# Patient Record
Sex: Female | Born: 1955 | Race: White | Hispanic: No | Marital: Married | Smoking: Current every day smoker
Health system: Southern US, Community
[De-identification: ages and names within clinical notes are randomized; demographics above are authoritative.]

## PROBLEM LIST (undated history)

## (undated) DIAGNOSIS — I10 Essential (primary) hypertension: Secondary | ICD-10-CM

## (undated) DIAGNOSIS — M1990 Unspecified osteoarthritis, unspecified site: Secondary | ICD-10-CM

## (undated) DIAGNOSIS — J302 Other seasonal allergic rhinitis: Secondary | ICD-10-CM

## (undated) DIAGNOSIS — E785 Hyperlipidemia, unspecified: Secondary | ICD-10-CM

## (undated) HISTORY — PX: APPENDECTOMY: SHX54

## (undated) HISTORY — PX: BLADDER REPAIR: SHX76

## (undated) HISTORY — PX: COLONOSCOPY: SHX174

## (undated) HISTORY — PX: TONSILLECTOMY: SUR1361

## (undated) HISTORY — PX: INNER EAR SURGERY: SHX679

## (undated) HISTORY — PX: ABDOMINAL HYSTERECTOMY: SHX81

---

## 2012-08-18 ENCOUNTER — Encounter: Payer: Self-pay | Admitting: *Deleted

## 2012-08-18 ENCOUNTER — Emergency Department
Admission: EM | Admit: 2012-08-18 | Discharge: 2012-08-18 | Disposition: A | Discharge status: Home / Self Care | Payer: BC Managed Care – PPO | Source: Home / Self Care | Attending: Family Medicine | Admitting: Family Medicine

## 2012-08-18 ENCOUNTER — Emergency Department (INDEPENDENT_AMBULATORY_CARE_PROVIDER_SITE_OTHER): Payer: BC Managed Care – PPO

## 2012-08-18 DIAGNOSIS — R05 Cough: Secondary | ICD-10-CM

## 2012-08-18 DIAGNOSIS — R0989 Other specified symptoms and signs involving the circulatory and respiratory systems: Secondary | ICD-10-CM

## 2012-08-18 DIAGNOSIS — J209 Acute bronchitis, unspecified: Secondary | ICD-10-CM

## 2012-08-18 DIAGNOSIS — R509 Fever, unspecified: Secondary | ICD-10-CM

## 2012-08-18 HISTORY — DX: Essential (primary) hypertension: I10

## 2012-08-18 HISTORY — DX: Other seasonal allergic rhinitis: J30.2

## 2012-08-18 HISTORY — DX: Hyperlipidemia, unspecified: E78.5

## 2012-08-18 MED ORDER — BENZONATATE 200 MG PO CAPS: 200.0000 mg | ORAL_CAPSULE | Freq: Every day | ORAL | Status: DC

## 2012-08-18 MED ORDER — DOXYCYCLINE HYCLATE 100 MG PO CAPS: 100.0000 mg | ORAL_CAPSULE | Freq: Two times a day (BID) | ORAL | Status: DC

## 2012-08-18 NOTE — ED Provider Notes (Signed)
History     CSN: [id number]  Arrival date & time 08/18/12  1131   First MD Initiated Contact with Patient 08/18/12 1201      Chief Complaint  Patient presents with  . Generalized Body Aches  . Cough       HPI Comments: Patient was sent to our facility from [facility] with recommendation for a chest X-ray. Patient complains of onset of a partly-productive cough 5 days ago, without sore throat.  She has a history of seasonal rhinitis but does not feel like she has had an increase in sinus congestion.  Yesterday she developed a fever and felt worse.  Her ears feel clogged.  She complains of tightness in her anterior chest, worse when coughing, and has occasional wheezing.  She smokes one-half pack per day  The history is provided by the patient.    Past Medical History  Diagnosis Date  . Hypertension   . Hyperlipemia   . Seasonal allergies     Past Surgical History  Procedure Laterality Date  . Abdominal hysterectomy    . Tonsillectomy    . Appendectomy    . Bladder repair      Family History  Problem Relation Age of Onset  . Diabetes Mother   . Stroke Mother     History  Substance Use Topics  . Smoking status: Current  Every Day Smoker -- 0.50 packs/day  . Smokeless tobacco: Not on file  . Alcohol Use: Yes    OB History   Grav Para Term Preterm Abortions TAB SAB Ect Mult Living                  Review of Systems No sore throat + cough No pleuritic pain but has tightness in anterior chest + wheezing + nasal congestion ? post-nasal drainage No sinus pain/pressure No itchy/red eyes ? earache No hemoptysis No SOB + fever, + chills No nausea No vomiting No abdominal pain No diarrhea No urinary symptoms No skin rashes + fatigue + myalgias No headache Used OTC meds without relief  Allergies  Ivp dye  Home Medications   Current Outpatient Rx  Name  Route  Sig  Dispense  Refill  . aspirin 81 MG tablet   Oral   Take 81 mg by mouth daily.         . Biotin 5000 MCG CAPS   Oral   Take by mouth.         . Cetirizine HCl (KLS ALLER-TEC PO)   Oral   Take by mouth.         . chlorthalidone (HYGROTON) 25 MG tablet   Oral   Take 25 mg by mouth daily.         . MULTIPLE VITAMIN PO   Oral   Take by mouth.         . niacin 100 MG tablet   Oral   Take 100 mg by mouth daily with breakfast.         . simvastatin (ZOCOR) 80 MG tablet   Oral   Take 80 mg by mouth at bedtime.         . vitamin E 400 UNIT capsule   Oral   Take 400 Units by mouth daily.         . benzonatate (TESSALON) 200 MG capsule   Oral   Take 1 capsule (200 mg total) by mouth at bedtime.   12 capsule   0   . doxycycline (VIBRAMYCIN) 100 MG capsule   Oral   Take 1 capsule (100 mg total) by mouth 2 (two) times daily.   20 capsule   0     BP 147/79  Pulse 72  Temp(Src) 98 F (36.7 C) (Oral)  Resp 18  Ht 5\' 4"  (1.626 m)  Wt 160 lb (72.576 kg)  BMI 27.45 kg/m2  SpO2 96%  Physical Exam Nursing notes and Vital Signs reviewed. Appearance:  Patient appears healthy, stated age, and in no acute distress Eyes:  Pupils are equal, round, and reactive to light and accomodation.   Extraocular movement is intact.  Conjunctivae are not inflamed  Ears:  Canals normal.  Tympanic membranes normal.  Nose:  Mildly congested turbinates.  No sinus tenderness.   Pharynx:  Normal Neck:  Supple.  No adenopathy  Lungs:   Rhonchi and wheezes left anterior base.  Breath sounds are equal.  Heart:  Regular rate and rhythm without murmurs, rubs, or gallops.  Abdomen:  Nontender without masses or hepatosplenomegaly.  Bowel sounds are present.  No CVA or flank tenderness.  Extremities:  No edema.  No calf tenderness Skin:  No rash present.   ED Course  Procedures  none   Dg Chest 2 View  08/18/2012  *RADIOLOGY REPORT*  Clinical Data: Cough, fever and congestion.  CHEST - 2 VIEW  Comparison: None.  Findings: Trachea is midline.  Heart size normal.  Lungs are mildly hyperinflated but otherwise clear.  No pleural fluid.  IMPRESSION: No acute findings.   Original Report Authenticated By: Leanna Battles, M.D.      1. Acute bronchitis       MDM  Begin doxycycline.  Prescription written for Benzonatate [facility]) to take at bedtime for night-time cough.   Take plain Mucinex (guaifenesin) twice daily for cough and congestion.  Increase fluid intake, rest. May use Afrin nasal spray (or generic oxymetazoline) twice daily for about 5 days.  Also recommend using saline nasal spray several times daily and saline nasal irrigation (AYR is a common brand) Stop all antihistamines for now, and other non-prescription cough/cold preparations. May take Ibuprofen 200mg , 4 tabs every 8 hours with food for chest/sternum discomfort. Follow-up with family doctor if not improving about one week.        Lattie Haw, MD 08/18/12 1255

## 2012-08-18 NOTE — ED Notes (Signed)
Pt c/o cough, body aches, wheezing and ears feel full x 5 days. She has mucinex, coricidin, and tylenol. She had a fever of 100.3 this weekend.

## 2014-10-04 IMAGING — CR DG CHEST 2V
2 series · 2 of 2 positions shown · non-contrast
Comparison: None.

CLINICAL DATA: Cough, fever and congestion.

CHEST - 2 VIEW

[view not recorded (1 of 2)]
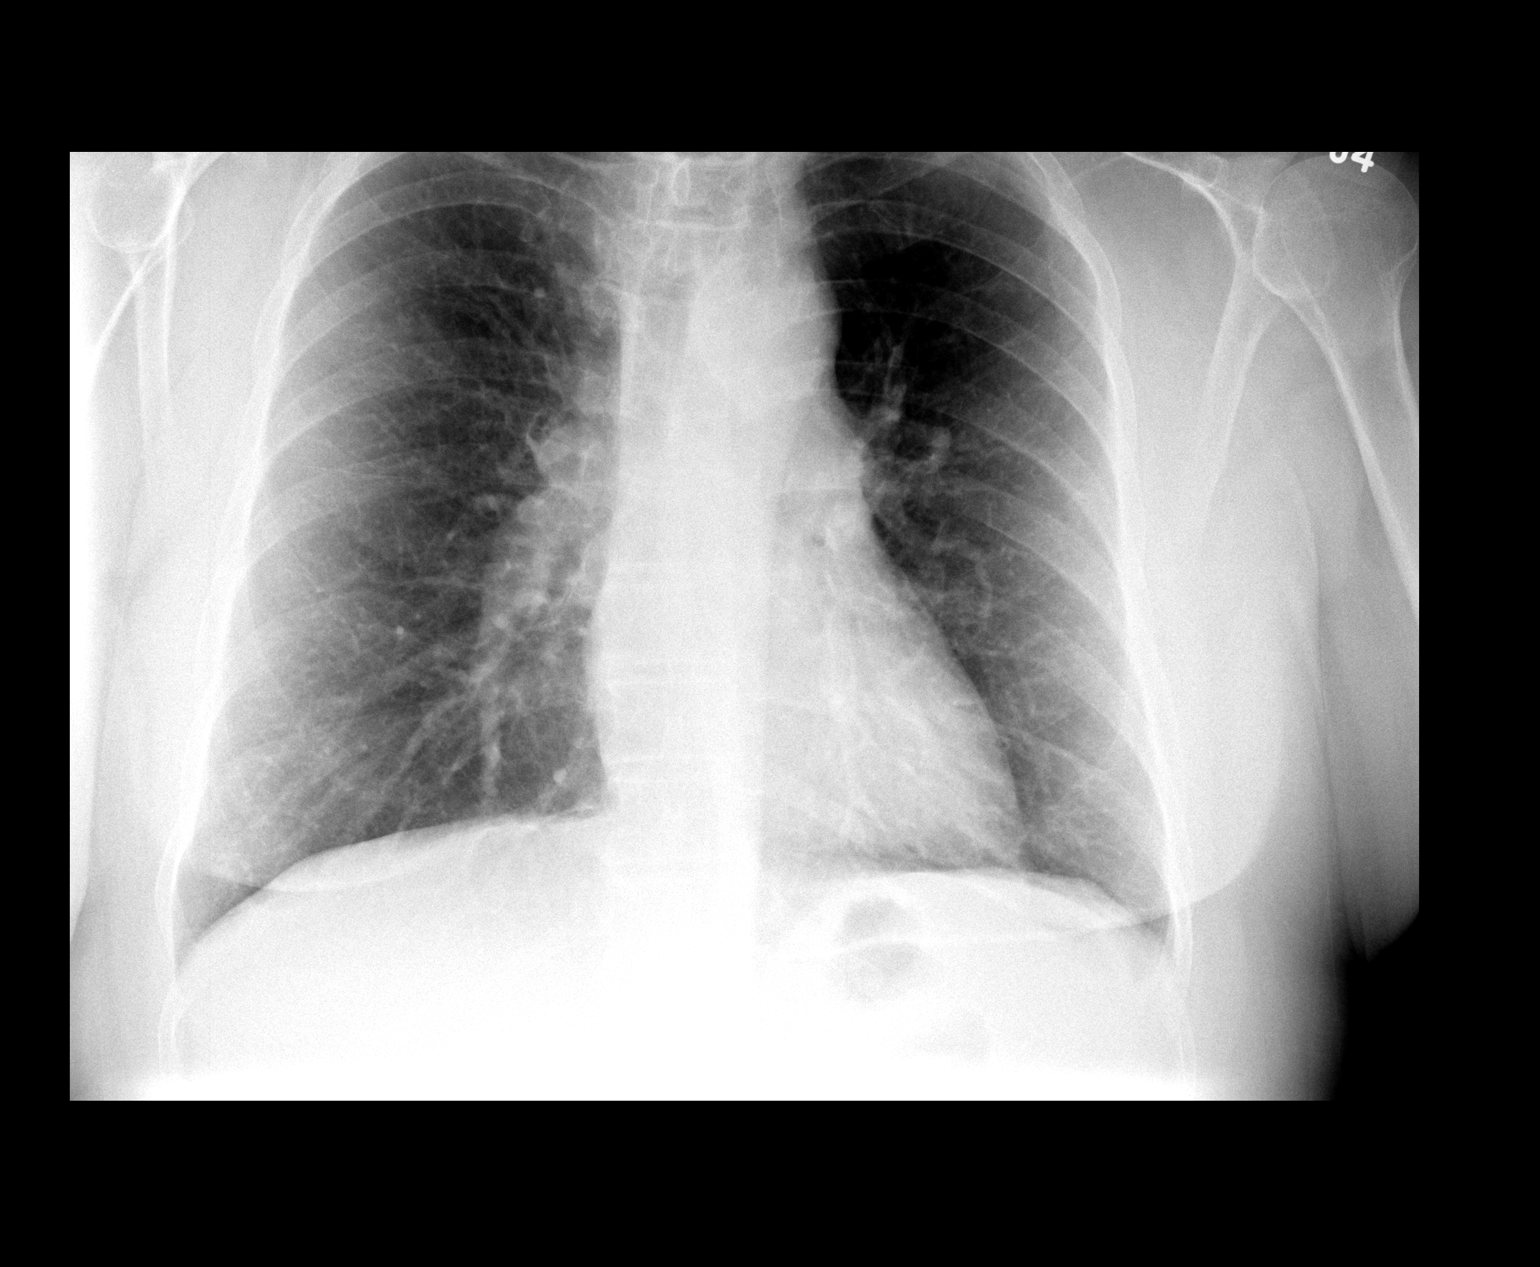

[view not recorded (2 of 2)]
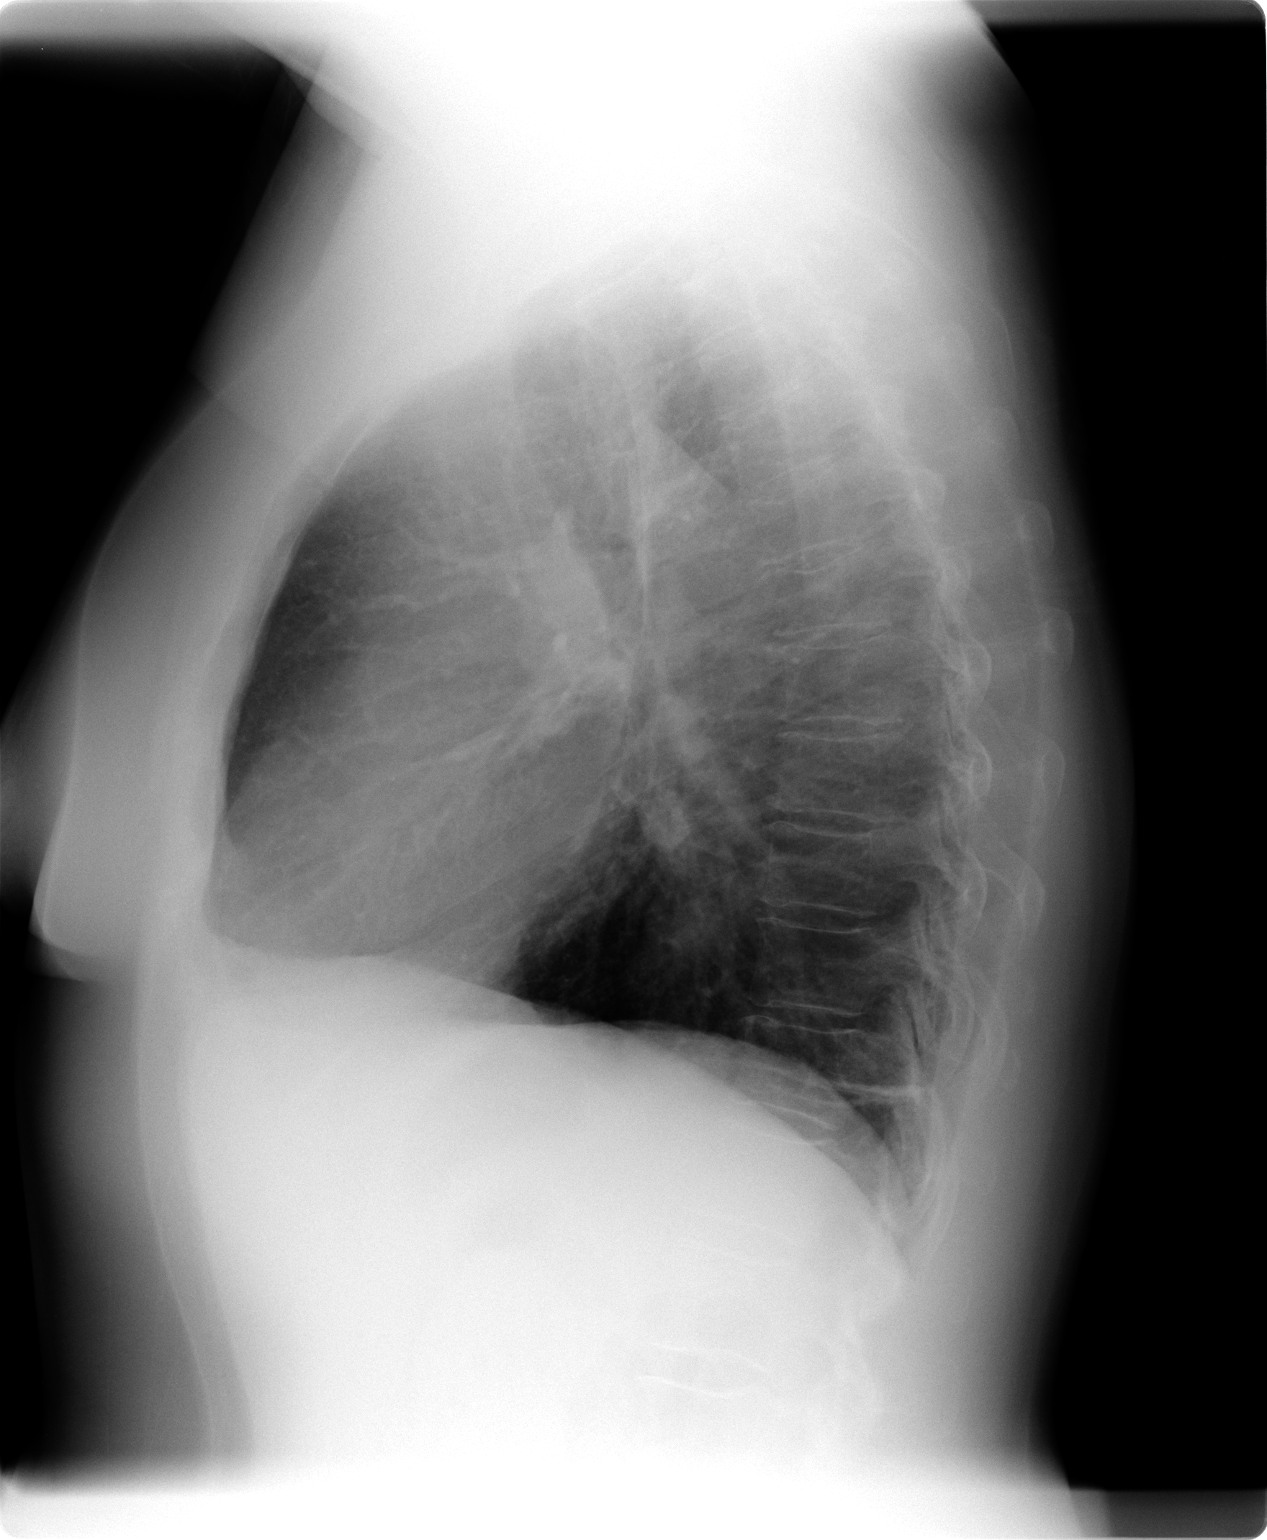

[2 of 2 positions shown; findings below may reference images not displayed]

FINDINGS: Trachea is midline.  Heart size normal.  Lungs are mildly
hyperinflated but otherwise clear.  No pleural fluid.
IMPRESSION: No acute findings.

## 2020-07-21 ENCOUNTER — Emergency Department (INDEPENDENT_AMBULATORY_CARE_PROVIDER_SITE_OTHER)
Admission: EM | Admit: 2020-07-21 | Discharge: 2020-07-21 | Disposition: A | Discharge status: Home / Self Care | Payer: Medicare Other | Source: Home / Self Care | Attending: Family Medicine | Admitting: Family Medicine

## 2020-07-21 ENCOUNTER — Other Ambulatory Visit: Payer: Self-pay

## 2020-07-21 DIAGNOSIS — H9202 Otalgia, left ear: Secondary | ICD-10-CM

## 2020-07-21 MED ORDER — AMOXICILLIN-POT CLAVULANATE 875-125 MG PO TABS: 1.0000 | ORAL_TABLET | Freq: Two times a day (BID) | ORAL | 0 refills | Status: DC

## 2020-07-21 MED ORDER — AZELASTINE-FLUTICASONE 137-50 MCG/ACT NA SUSP: 1.0000 | Freq: Two times a day (BID) | NASAL | 0 refills | Status: DC

## 2020-07-21 NOTE — ED Provider Notes (Signed)
[facility]    CSN: [id number] Arrival date & time: 07/21/20  1656      History   Chief Complaint Chief Complaint  Patient presents with  . Otalgia    LT    HPI Amber Tate is a 65 y.o. female.   HPI   Patient has a problem with left ear.  It is painful.  Has dereased hearing.  Has noted drainage on the pillow in the morning when she wakes up.  Has some allergies as well.  No real runny or stuffy nose, no pressure pain in the sinuses.  No sore throat.  No fever Has a history of problems with this ear in the past.  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  A patch was placed. Under care for hypertension hyperlipidemia.  Has a primary care doctor.  Past Medical History:  Diagnosis Date  . Hyperlipemia   . Hypertension   . Seasonal allergies     There are no problems to display for this patient.   Past Surgical History:  Procedure Laterality Date  . ABDOMINAL HYSTERECTOMY    . APPENDECTOMY    . BLADDER REPAIR    . TONSILLECTOMY      OB History   No obstetric history on file.      Home Medications    Prior to Admission medications   Medication Sig Start Date End Date Taking? Authorizing Provider  amoxicillin-clavulanate (AUGMENTIN) 875-125 MG tablet Take 1 tablet by mouth every 12 (twelve) hours. 07/21/20  Yes Eustace Moore, MD  Azelastine-Fluticasone [phone] MCG/ACT SUSP Place 1 puff into the nose in the morning and at bedtime. 07/21/20  Yes Eustace Moore, MD  amLODipine (NORVASC) 2.5 MG tablet Take 2.5 mg by mouth daily. 06/09/20   [provider]  aspirin 81 MG tablet Take 81 mg by mouth daily.    [provider]  Biotin 5000 MCG CAPS Take by mouth.    [provider]  Cetirizine HCl (KLS ALLER-TEC PO) Take by mouth.    [provider]  MULTIPLE VITAMIN PO Take by mouth.    [provider]  rosuvastatin (CRESTOR) 10 MG tablet Take 10 mg by mouth daily.  06/09/20   [provider]  vitamin E 400 UNIT capsule Take 400 Units by mouth daily.    [provider]  chlorthalidone (HYGROTON) 25 MG tablet Take 25 mg by mouth daily.  07/21/20  [provider]  simvastatin (ZOCOR) 80 MG tablet Take 80 mg by mouth at bedtime.  07/21/20  [provider]    Family History Family History  Problem Relation Age of Onset  . Diabetes Mother   . Stroke Mother     Social History Social History   Tobacco Use  . Smoking status: Current Every Day Smoker    Packs/day: 0.50  . Smokeless tobacco: Never Used  Vaping Use  . Vaping Use: Never used  Substance Use Topics  . Alcohol use: Yes  . Drug use: No     Allergies   Ivp dye [iodinated diagnostic agents]   Review of Systems Review of Systems See HPI  Physical Exam Triage Vital Signs ED Triage Vitals  Enc Vitals Group     BP 07/21/20 1715 (!) 158/87     Pulse Rate 07/21/20 1715 75     Resp 07/21/20 1715 18     Temp 07/21/20 1715 98.1 F (36.7 C)  Temp Source 07/21/20 1715 Oral     SpO2 07/21/20 1715 95 %     Weight --      Height --      Head Circumference --      Peak Flow --      Pain Score 07/21/20 1718 6     Pain Loc --      Pain Edu? --      Excl. in [place]? --    No data found.  Updated Vital Signs BP (!) 158/87 (BP Location: Left Arm)   Pulse 75   Temp 98.1 F (36.7 C) (Oral)   Resp 18   SpO2 95%      Physical Exam Constitutional:      General: She is not in acute distress.    Appearance: She is well-developed.  HENT:     Head: Normocephalic and atraumatic.     Right Ear: Tympanic membrane, ear canal and external ear normal.     Left Ear: Ear canal and external ear normal.     Ears:     Comments: Scarring of TM.  Dull appearance.  Moisture is seen in the proximal EAC with no visible perforation Eyes:     Conjunctiva/sclera: Conjunctivae normal.     Pupils: Pupils are equal, round, and reactive to light.  Cardiovascular:      Rate and Rhythm: Normal rate.  Pulmonary:     Effort: Pulmonary effort is normal. No respiratory distress.  Abdominal:     General: There is no distension.     Palpations: Abdomen is soft.  Musculoskeletal:        General: Normal range of motion.     Cervical back: Normal range of motion.  Skin:    General: Skin is warm and dry.  Neurological:     Mental Status: She is alert.  Psychiatric:        Behavior: Behavior normal.      UC Treatments / Results  Labs (all labs ordered are listed, but only abnormal results are displayed) Labs Reviewed - No data to display  EKG   Radiology No results found.  Procedures Procedures (including critical care time)  Medications Ordered in UC Medications - No data to display  Initial Impression / Assessment and Plan / UC Course  I have reviewed the triage vital signs and the nursing notes.  Pertinent labs & imaging results that were available during my care of the patient were reviewed by me and considered in my medical decision making (see chart for details).     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  There may just be a small rent in the surface.  We will treat with Augmentin for any infection.  We will treat with Flonase for sinus and ear pressure.  May need to follow with ENT if fails to improve Final Clinical Impressions(s) / UC Diagnoses   Final diagnoses:  Otalgia, left ear     Discharge Instructions     Antibiotic 2 times a day Drink plenty of water Use the nasal spray twice a day for a week and then once a day until your symptoms improve See your doctor if not improving in a week   ED Prescriptions    Medication Sig Dispense Auth. Provider   amoxicillin-clavulanate (AUGMENTIN) 875-125 MG tablet Take 1 tablet by mouth every 12 (twelve) hours. 14 tablet Eustace Moore, MD   Azelastine-Fluticasone [phone] MCG/ACT SUSP Place  1 puff into the nose in the  morning and at bedtime. 23 g Eustace Moore, MD     PDMP not reviewed this encounter.   Eustace Moore, MD 07/21/20 1754

## 2020-07-21 NOTE — Discharge Instructions (Addendum)
Antibiotic 2 times a day Drink plenty of water Use the nasal spray twice a day for a week and then once a day until your symptoms improve See your doctor if not improving in a week

## 2020-07-21 NOTE — ED Triage Notes (Signed)
Pt c/o LT ear pain since yesterday. Some drainage noticed on her pillow. Some radiating jaw pain. Pain 6/10

## 2022-01-19 ENCOUNTER — Ambulatory Visit
Admission: EM | Admit: 2022-01-19 | Discharge: 2022-01-19 | Disposition: A | Discharge status: Home / Self Care | Payer: Medicare Other | Attending: Physician Assistant | Admitting: Physician Assistant

## 2022-01-19 ENCOUNTER — Encounter: Payer: Self-pay | Admitting: Emergency Medicine

## 2022-01-19 ENCOUNTER — Ambulatory Visit (INDEPENDENT_AMBULATORY_CARE_PROVIDER_SITE_OTHER): Payer: Medicare Other

## 2022-01-19 DIAGNOSIS — J069 Acute upper respiratory infection, unspecified: Secondary | ICD-10-CM

## 2022-01-19 DIAGNOSIS — R059 Cough, unspecified: Secondary | ICD-10-CM

## 2022-01-19 LAB — POC SARS CORONAVIRUS 2 AG -  ED: SARS Coronavirus 2 Ag: NEGATIVE

## 2022-01-19 MED ORDER — BENZONATATE 100 MG PO CAPS: 100.0000 mg | ORAL_CAPSULE | Freq: Four times a day (QID) | ORAL | 0 refills | Status: DC | PRN

## 2022-01-19 NOTE — ED Triage Notes (Signed)
Pt c/o cough with congestion and chest feels full and burning, taking OTC cold meds. This has been going on about 5 days. She is a smoker.

## 2022-01-19 NOTE — ED Provider Notes (Signed)
[facility]    CSN: [id number] Arrival date & time: 01/19/22  1130      History   Chief Complaint Chief Complaint  Patient presents with   Cough    HPI Amber Tate is a 66 y.o. female.   Patient complains of cough and congestion.  Patient reports her chest feels like it is burning.  Patient denies fever or chills.  Patient denies sinus pressure she denies sore throat or earache  The history is provided by the patient. No language interpreter was used.  Cough Cough characteristics:  Non-productive Sputum characteristics:  Nondescript Severity:  Moderate Onset quality:  Gradual Timing:  Constant Progression:  Worsening Relieved by:  Nothing Worsened by:  Nothing Ineffective treatments:  None tried Associated symptoms: no shortness of breath and no sinus congestion     Past Medical History:  Diagnosis Date   Hyperlipemia    Hypertension    Seasonal allergies     There are no problems to display for this patient.   Past Surgical History:  Procedure Laterality Date   ABDOMINAL HYSTERECTOMY     APPENDECTOMY     BLADDER REPAIR     TONSILLECTOMY      OB History   No obstetric history on file.      Home Medications    Prior to Admission medications   Medication Sig Start Date End Date Taking? Authorizing Provider  benzonatate (TESSALON PERLES) 100 MG capsule Take 1 capsule (100 mg total) by mouth every 6 (six) hours as needed for cough. 01/19/22 01/19/23 Yes Cheron Schaumann K, PA-C  amLODipine (NORVASC) 2.5 MG tablet Take 2.5 mg by mouth daily. 06/09/20   [provider]  amoxicillin-clavulanate (AUGMENTIN) 875-125 MG tablet Take 1 tablet by mouth every 12 (twelve) hours. 07/21/20   Eustace Moore, MD  aspirin 81 MG tablet Take 81 mg by mouth daily.    [provider]  Azelastine-Fluticasone 137-50 MCG/ACT SUSP Place 1 puff into the nose in the morning and at bedtime. 07/21/20   Eustace Moore, MD  Biotin 5000 MCG CAPS Take  by mouth.    [provider]  Cetirizine HCl (KLS ALLER-TEC PO) Take by mouth.    [provider]  MULTIPLE VITAMIN PO Take by mouth.    [provider]  rosuvastatin (CRESTOR) 10 MG tablet Take 10 mg by mouth daily. 06/09/20   [provider]  vitamin E 400 UNIT capsule Take 400 Units by mouth daily.    [provider]  chlorthalidone (HYGROTON) 25 MG tablet Take 25 mg by mouth daily.  07/21/20  [provider]  simvastatin (ZOCOR) 80 MG tablet Take 80 mg by mouth at bedtime.  07/21/20  [provider]    Family History Family History  Problem Relation Age of Onset   Diabetes Mother    Stroke Mother     Social History Social History   Tobacco Use   Smoking status: Every Day    Packs/day: 0.50    Types: Cigarettes   Smokeless tobacco: Never  Vaping Use   Vaping Use: Never used  Substance Use Topics   Alcohol use: Yes   Drug use: No     Allergies   Ivp dye [iodinated contrast media]   Review of Systems Review of Systems  Respiratory:  Positive for cough. Negative for shortness of breath.   All other systems reviewed and are negative.    Physical Exam Triage Vital Signs ED Triage Vitals  Enc Vitals Group     BP 01/19/22 1242 (!) 154/83     Pulse Rate 01/19/22 1242 61     Resp 01/19/22 1242 18     Temp 01/19/22 1242 97.6 F (36.4 C)     Temp Source 01/19/22 1242 Oral     SpO2 01/19/22 1242 98 %     Weight --      Height --      Head Circumference --      Peak Flow --      Pain Score 01/19/22 1244 0     Pain Loc --      Pain Edu? --      Excl. in [place]? --    No data found.  Updated Vital Signs BP (!) 154/83 (BP Location: Right Arm)   Pulse 61   Temp 97.6 F (36.4 C) (Oral)   Resp 18   SpO2 98%   Visual Acuity Right Eye Distance:   Left Eye Distance:   Bilateral Distance:    Right Eye Near:   Left Eye Near:    Bilateral Near:     Physical Exam Vitals and nursing note reviewed.   Constitutional:      Appearance: She is well-developed.  HENT:     Head: Normocephalic.     Right Ear: Tympanic membrane normal.     Left Ear: Tympanic membrane normal.     Mouth/Throat:     Mouth: Mucous membranes are moist.  Eyes:     Extraocular Movements: Extraocular movements intact.     Pupils: Pupils are equal, round, and reactive to light.  Cardiovascular:     Rate and Rhythm: Normal rate.  Pulmonary:     Effort: Pulmonary effort is normal.  Abdominal:     General: There is no distension.  Musculoskeletal:        General: Normal range of motion.     Cervical back: Normal range of motion.  Neurological:     Mental Status: She is alert and oriented to person, place, and time.      UC Treatments / Results  Labs (all labs ordered are listed, but only abnormal results are displayed) Labs Reviewed  POC SARS CORONAVIRUS 2 AG -  ED    EKG   Radiology DG Chest 2 View  Result Date: 01/19/2022 CLINICAL DATA:  Cough EXAM: CHEST - 2 VIEW COMPARISON:  08/18/2012 chest radiograph. FINDINGS: Stable cardiomediastinal silhouette with normal heart size. No pneumothorax. No pleural effusion. Lungs appear clear, with no acute consolidative airspace disease and no pulmonary edema. IMPRESSION: No active cardiopulmonary disease. Electronically Signed   By: Ilona Sorrel M.D.   On: 01/19/2022 13:45    Procedures Procedures (including critical care time)  Medications Ordered in UC Medications - No data to display  Initial Impression / Assessment and Plan / UC Course  I have reviewed the triage vital signs and the nursing notes.  Pertinent labs & imaging results that were available during my care of the patient were reviewed by me and considered in my medical decision making (see chart for details).     MDM chest x-ray shows no acute cardiopulmonary disease.  COVID is negative Final Clinical Impressions(s) / UC Diagnoses   Final diagnoses:  Viral URI with cough   Discharge  Instructions   None    ED Prescriptions     Medication Sig Dispense Auth. Provider   benzonatate (TESSALON PERLES) 100 MG capsule Take 1 capsule (100 mg total) by mouth  every 6 (six) hours as needed for cough. 30 capsule Elson Areas, [place]      PDMP not reviewed this encounter. An After Visit Summary was printed and given to the patient.    Elson Areas, [place] 01/19/22 1955

## 2022-05-01 ENCOUNTER — Ambulatory Visit
Admission: EM | Admit: 2022-05-01 | Discharge: 2022-05-01 | Disposition: A | Discharge status: Home / Self Care | Payer: Medicare Other

## 2022-05-01 DIAGNOSIS — R059 Cough, unspecified: Secondary | ICD-10-CM | POA: Diagnosis not present

## 2022-05-01 LAB — POCT INFLUENZA A/B
Influenza A, POC: NEGATIVE
Influenza B, POC: NEGATIVE

## 2022-05-01 MED ORDER — BENZONATATE 200 MG PO CAPS: 200.0000 mg | ORAL_CAPSULE | Freq: Three times a day (TID) | ORAL | 0 refills | Status: AC | PRN

## 2022-05-01 MED ORDER — AZITHROMYCIN 250 MG PO TABS: 250.0000 mg | ORAL_TABLET | Freq: Every day | ORAL | 0 refills | Status: DC

## 2022-05-01 NOTE — ED Provider Notes (Addendum)
[facility]    CSN: [id number] Arrival date & time: 05/01/22  1140      History   Chief Complaint Chief Complaint  Patient presents with   Cough   Flu exposure    HPI Amber Tate is a 67 y.o. female.   HPI Pleasant 67 year old female presents with influenza exposure and cough that started yesterday reports husband tested positive for influenza A yesterday.  PMH significant for HTN, current everyday cigarette smoker, and HLD.  Past Medical History:  Diagnosis Date   Hyperlipemia    Hypertension    Seasonal allergies     There are no problems to display for this patient.   Past Surgical History:  Procedure Laterality Date   ABDOMINAL HYSTERECTOMY     APPENDECTOMY     BLADDER REPAIR     TONSILLECTOMY      OB History   No obstetric history on file.      Home Medications    Prior to Admission medications   Medication Sig Start Date End Date Taking? Authorizing Provider  azithromycin (ZITHROMAX) 250 MG tablet Take 1 tablet (250 mg total) by mouth daily. Take first 2 tablets together, then 1 every day until finished. 05/01/22  Yes Eliezer Lofts, FNP  benzonatate (TESSALON) 200 MG capsule Take 1 capsule (200 mg total) by mouth 3 (three) times daily as needed for up to 7 days. 05/01/22 05/08/22 Yes Eliezer Lofts, FNP  albuterol (VENTOLIN HFA) 108 (90 Base) MCG/ACT inhaler Inhale into the lungs.    [provider]  amLODipine (NORVASC) 2.5 MG tablet Take 2.5 mg by mouth daily. 06/09/20   [provider]  aspirin 81 MG tablet Take 81 mg by mouth daily.    [provider]  Azelastine-Fluticasone 137-50 MCG/ACT SUSP Place 1 puff into the nose in the morning and at bedtime. 07/21/20   Raylene Everts, MD  Biotin 5000 MCG CAPS Take by mouth.    [provider]  Cetirizine HCl (KLS ALLER-TEC PO) Take by mouth.    [provider]  MULTIPLE VITAMIN PO Take by mouth.    [provider]  rosuvastatin (CRESTOR) 10  MG tablet Take 10 mg by mouth daily. 06/09/20   [provider]  vitamin E 400 UNIT capsule Take 400 Units by mouth daily.    [provider]  chlorthalidone (HYGROTON) 25 MG tablet Take 25 mg by mouth daily.  07/21/20  [provider]  simvastatin (ZOCOR) 80 MG tablet Take 80 mg by mouth at bedtime.  07/21/20  [provider]    Family History Family History  Problem Relation Age of Onset   Diabetes Mother    Stroke Mother     Social History Social History   Tobacco Use   Smoking status: Every Day    Packs/day: 0.50    Types: Cigarettes   Smokeless tobacco: Never  Vaping Use   Vaping Use: Never used  Substance Use Topics   Alcohol use: Yes   Drug use: No     Allergies   Ivp dye [iodinated contrast media]   Review of Systems Review of Systems  Respiratory:  Positive for cough.   All other systems reviewed and are negative.    Physical Exam Triage Vital Signs ED Triage Vitals  Enc Vitals Group     BP 05/01/22 1159 (!) 152/90     Pulse Rate 05/01/22 1159 71     Resp 05/01/22 1159 18     Temp  05/01/22 1159 (!) 97.4 F (36.3 C)     Temp Source 05/01/22 1159 Oral     SpO2 05/01/22 1159 98 %     Weight --      Height --      Head Circumference --      Peak Flow --      Pain Score 05/01/22 1201 0     Pain Loc --      Pain Edu? --      Excl. in GC? --    No data found.  Updated Vital Signs BP (!) 152/90 (BP Location: Left Arm)   Pulse 71   Temp (!) 97.4 F (36.3 C) (Oral)   Resp 18   SpO2 98%   Visual Acuity Right Eye Distance:   Left Eye Distance:   Bilateral Distance:    Right Eye Near:   Left Eye Near:    Bilateral Near:     Physical Exam Vitals reviewed.  Constitutional:      Appearance: Normal appearance. She is normal weight. She is ill-appearing.  HENT:     Head: Normocephalic and atraumatic.     Right Ear: Tympanic membrane, ear canal and external ear normal.     Left Ear: Tympanic membrane, ear  canal and external ear normal.     Mouth/Throat:     Mouth: Mucous membranes are moist.     Pharynx: Oropharynx is clear.  Eyes:     Extraocular Movements: Extraocular movements intact.     Conjunctiva/sclera: Conjunctivae normal.     Pupils: Pupils are equal, round, and reactive to light.  Cardiovascular:     Rate and Rhythm: Normal rate and regular rhythm.     Pulses: Normal pulses.     Heart sounds: Normal heart sounds.  Pulmonary:     Effort: Pulmonary effort is normal.     Breath sounds: Normal breath sounds. No wheezing, rhonchi or rales.     Comments: Infrequent nonproductive cough noted on exam Musculoskeletal:        General: Normal range of motion.     Cervical back: Normal range of motion and neck supple.  Skin:    General: Skin is warm and dry.  Neurological:     General: No focal deficit present.     Mental Status: She is alert and oriented to person, place, and time.      UC Treatments / Results  Labs (all labs ordered are listed, but only abnormal results are displayed) Labs Reviewed  POCT INFLUENZA A/B    EKG   Radiology No results found.  Procedures Procedures (including critical care time)  Medications Ordered in UC Medications - No data to display  Initial Impression / Assessment and Plan / UC Course  I have reviewed the triage vital signs and the nursing notes.  Pertinent labs & imaging results that were available during my care of the patient were reviewed by me and considered in my medical decision making (see chart for details).     MDM: 1. Cough-Rx'd Zithromax, Tessalon Perles. Instructed patient to take medication as directed with food to completion.  Advised may take Tessalon Perles daily or as needed for cough.  Encourage patient to increase daily water intake while taking these medications.  Advised if symptoms worsen and/or unresolved please follow-up with PCP or here for further evaluation. Final Clinical Impressions(s) / UC  Diagnoses   Final diagnoses:  Cough, unspecified type     Discharge Instructions  Instructed patient to take medication as directed with food to completion.  Advised may take Tessalon Perles daily or as needed for cough.  Encourage patient to increase daily water intake while taking these medications.  Advised if symptoms worsen and/or unresolved please follow-up with PCP or here for further evaluation.     ED Prescriptions     Medication Sig Dispense Auth. Provider   azithromycin (ZITHROMAX) 250 MG tablet Take 1 tablet (250 mg total) by mouth daily. Take first 2 tablets together, then 1 every day until finished. 6 tablet Trevor Iha, FNP   benzonatate (TESSALON) 200 MG capsule Take 1 capsule (200 mg total) by mouth 3 (three) times daily as needed for up to 7 days. 40 capsule Trevor Iha, FNP      PDMP not reviewed this encounter.   Trevor Iha, FNP 05/01/22 1304    Trevor Iha, FNP 05/01/22 1525    Trevor Iha, FNP 05/01/22 1525

## 2022-05-01 NOTE — ED Triage Notes (Signed)
Pt c/o cough that started yesterday. Denies fever however Husband tested pos for flu A yesterday. Coricidin prn.

## 2022-05-01 NOTE — Discharge Instructions (Addendum)
Instructed patient to take medication as directed with food to completion.  Advised may take Tessalon Perles daily or as needed for cough.  Encourage patient to increase daily water intake while taking these medications.  Advised if symptoms worsen and/or unresolved please follow-up with PCP or here for further evaluation.

## 2023-01-11 ENCOUNTER — Encounter: Payer: Self-pay | Admitting: Emergency Medicine

## 2023-01-11 ENCOUNTER — Ambulatory Visit
Admission: EM | Admit: 2023-01-11 | Discharge: 2023-01-11 | Disposition: A | Discharge status: Home / Self Care | Payer: Medicare Other

## 2023-01-11 DIAGNOSIS — R202 Paresthesia of skin: Secondary | ICD-10-CM

## 2023-01-11 NOTE — ED Provider Notes (Signed)
[facility]    CSN: [id number] Arrival date & time: 01/11/23  1755      History   Chief Complaint Chief Complaint  Patient presents with   Numbness   Dizziness    HPI Amber Tate is a 67 y.o. female.   Pt reports numbness and tingling in both arms for over a week.  Pt reports a book fell off of a shelf and hit her in the head.  Pt reports today she had a hot sensation in her chest.  Pt worried that she is having a stroke.  Pt has a histroy of high blood pressure   Dizziness Associated symptoms: weakness     Past Medical History:  Diagnosis Date   Hyperlipemia    Hypertension    Seasonal allergies     There are no problems to display for this patient.   Past Surgical History:  Procedure Laterality Date   ABDOMINAL HYSTERECTOMY     APPENDECTOMY     BLADDER REPAIR     TONSILLECTOMY      OB History   No obstetric history on file.      Home Medications    Prior to Admission medications   Medication Sig Start Date End Date Taking? Authorizing Provider  albuterol (VENTOLIN HFA) 108 (90 Base) MCG/ACT inhaler Inhale into the lungs.   Yes [provider]  aspirin 81 MG tablet Take 81 mg by mouth daily.   Yes [provider]  Azelastine-Fluticasone 137-50 MCG/ACT SUSP Place 1 puff into the nose in the morning and at bedtime. 07/21/20  Yes Eustace Moore, MD  Cetirizine HCl (KLS ALLER-TEC PO) Take by mouth.   Yes [provider]  estradiol (ESTRACE) 0.1 MG/GM vaginal cream Apply a pea-sized amount 2-3 times per week intravaginally. Strength: 0.01% (0.1 mg/g) 10/23/21  Yes [provider]  Boris Lown Oil (OMEGA-3) 500 MG CAPS Take by mouth. 03/16/19  Yes [provider]  amLODipine (NORVASC) 2.5 MG tablet Take 2.5 mg by mouth daily. 06/09/20   [provider]  azithromycin (ZITHROMAX) 250 MG tablet Take 1 tablet (250 mg total) by mouth daily. Take first 2 tablets together, then 1 every day until finished.  05/01/22   Trevor Iha, FNP  Biotin 5000 MCG CAPS Take by mouth.    [provider]  Calcium Carbonate-Vit D-Min (CALCIUM 1200 PO)     [provider]  losartan-hydrochlorothiazide (HYZAAR) 50-12.5 MG tablet Take 1 tablet by mouth daily. 08/29/22 08/29/23  [provider]  MULTIPLE VITAMIN PO Take by mouth.    [provider]  rosuvastatin (CRESTOR) 10 MG tablet Take 10 mg by mouth daily. 06/09/20   [provider]  SIMVASTATIN PO simvastatin    [provider]  Vitamin E 100 units TABS     [provider]  vitamin E 400 UNIT capsule Take 400 Units by mouth daily.    [provider]  chlorthalidone (HYGROTON) 25 MG tablet Take 25 mg by mouth daily.  07/21/20  [provider]    Family History Family History  Problem Relation Age of Onset   Diabetes Mother    Stroke Mother     Social History Social History   Tobacco Use   Smoking status: Every Day    Current packs/day: 0.50    Types: Cigarettes   Smokeless tobacco: Never  Vaping Use   Vaping status: Never Used  Substance Use Topics   Alcohol use: Yes   Drug use:  No     Allergies   Ivp dye [iodinated contrast media]   Review of Systems Review of Systems  Eyes:  Negative for visual disturbance.  Neurological:  Positive for dizziness and weakness.   Pt complains of weakness in both arms and both legs  Physical Exam Triage Vital Signs ED Triage Vitals  Encounter Vitals Group     BP 01/11/23 1813 (!) 189/99     Systolic BP Percentile --      Diastolic BP Percentile --      Pulse Rate 01/11/23 1813 64     Resp 01/11/23 1813 16     Temp 01/11/23 1813 97.6 F (36.4 C)     Temp Source 01/11/23 1813 Oral     SpO2 01/11/23 1813 98 %     Weight --      Height --      Head Circumference --      Peak Flow --      Pain Score 01/11/23 1814 6     Pain Loc --      Pain Education --      Exclude from Growth Chart --    No data found.  Updated  Vital Signs BP (!) 189/99 (BP Location: Left Arm)   Pulse 64   Temp 97.6 F (36.4 C) (Oral)   Resp 16   SpO2 98%   Visual Acuity Right Eye Distance:   Left Eye Distance:   Bilateral Distance:    Right Eye Near:   Left Eye Near:    Bilateral Near:     Physical Exam Vitals and nursing note reviewed.  Constitutional:      Appearance: She is well-developed.  HENT:     Head: Normocephalic.  Cardiovascular:     Rate and Rhythm: Normal rate.  Pulmonary:     Effort: Pulmonary effort is normal.  Abdominal:     General: There is no distension.  Musculoskeletal:        General: Normal range of motion.  Skin:    General: Skin is warm.  Neurological:     General: No focal deficit present.     Mental Status: She is alert and oriented to person, place, and time.      UC Treatments / Results  Labs (all labs ordered are listed, but only abnormal results are displayed) Labs Reviewed - No data to display  EKG   Radiology No results found.  Procedures Procedures (including critical care time)  Medications Ordered in UC Medications - No data to display  Initial Impression / Assessment and Plan / UC Course  I have reviewed the triage vital signs and the nursing notes.  Pertinent labs & imaging results that were available during my care of the patient were reviewed by me and considered in my medical decision making (see chart for details).     Pt advised to go to ED.  Pt needs lab work and imaging not available at urgent care.  Pt's husband is driving Final Clinical Impressions(s) / UC Diagnoses   Final diagnoses:  Paresthesia     Discharge Instructions      Go to the Emergency department for evaltuion now   ED Prescriptions   None    PDMP not reviewed this encounter.   Elson Areas, [place] 01/11/23 1843

## 2023-01-11 NOTE — ED Notes (Signed)
Patient is being discharged from the Urgent Care and sent to the Emergency Department via POV . Per L. K. Sofia PA, patient is in need of higher level of care due to numbness, dizziness. Patient is aware and verbalizes understanding of plan of care.  Vitals:   01/11/23 1813  BP: (!) 189/99  Pulse: 64  Resp: 16  Temp: 97.6 F (36.4 C)  SpO2: 98%

## 2023-01-11 NOTE — Discharge Instructions (Signed)
Go to the Emergency department for evaltuion now

## 2023-01-11 NOTE — ED Triage Notes (Addendum)
1 week ago noticed intermittent numbness/tingling in all extremities. Continued to come and go throughout the week. Today the numbness and tingling has spread throughout her whole body, and most noticeably in her lips. Reports new dizziness, nausea, and has been diaphoretic. Also reports some SOB and a "heat" in her chest area, does report smoking history.

## 2023-05-27 NOTE — Patient Instructions (Signed)
DUE TO COVID-19 ONLY TWO VISITORS  (aged 70 and older)  ARE ALLOWED TO COME WITH YOU AND STAY IN THE WAITING ROOM ONLY DURING PRE OP AND PROCEDURE.   **NO VISITORS ARE ALLOWED IN THE SHORT STAY AREA OR RECOVERY ROOM!!**  IF YOU WILL BE ADMITTED INTO THE HOSPITAL YOU ARE ALLOWED ONLY FOUR SUPPORT PEOPLE DURING VISITATION HOURS ONLY (7 AM -8PM)   The support person(s) must pass our screening, gel in and out, and wear a mask at all times, including in the patient's room. Patients must also wear a mask when staff or their support person are in the room. Visitors GUEST BADGE MUST BE WORN VISIBLY  One adult visitor may remain with you overnight and MUST be in the room by 8 P.M.     Your procedure is scheduled on: 06/04/23   Report to [facility] Main Entrance    Report to admitting at: 6:00 AM   Call this number if you have problems the morning of surgery [phone]   Do not eat food :After Midnight.   After Midnight you may have the following liquids until : 5:30 AM DAY OF SURGERY  Water Black Coffee (sugar ok, NO MILK/CREAM OR CREAMERS)  Tea (sugar ok, NO MILK/CREAM OR CREAMERS) regular and decaf                             Plain Jell-O (NO RED)                                           Fruit ices (not with fruit pulp, NO RED)                                     Popsicles (NO RED)                                                                  Juice: apple, WHITE grape, WHITE cranberry Sports drinks like Gatorade (NO RED)   The day of surgery:  Drink ONE (1) Pre-Surgery Clear Ensure at : 5:30 AM the morning of surgery. Drink in one sitting. Do not sip.  This drink was given to you during your hospital  pre-op appointment visit. Nothing else to drink after completing the  Pre-Surgery Clear Ensure or G2.          If you have questions, please contact your surgeon's office.  FOLLOW ANY ADDITIONAL PRE OP INSTRUCTIONS YOU RECEIVED FROM YOUR SURGEON'S OFFICE!!!     Oral  Hygiene is also important to reduce your risk of infection.                                    Remember - BRUSH YOUR TEETH THE MORNING OF SURGERY WITH YOUR REGULAR TOOTHPASTE  DENTURES WILL BE REMOVED PRIOR TO SURGERY PLEASE DO NOT APPLY "Poly grip" OR ADHESIVES!!!   Do NOT smoke after Midnight   Take these medicines the morning of  surgery with A SIP OF WATER: omeprazole.Use inhalers as usual.Cetirizine as needed.                              You may not have any metal on your body including hair pins, jewelry, and body piercing             Do not wear make-up, lotions, powders, perfumes/cologne, or deodorant  Do not wear nail polish including gel and S&S, artificial/acrylic nails, or any other type of covering on natural nails including finger and toenails. If you have artificial nails, gel coating, etc. that needs to be removed by a nail salon please have this removed prior to surgery or surgery may need to be canceled/ delayed if the surgeon/ anesthesia feels like they are unable to be safely monitored.   Do not shave  48 hours prior to surgery.    Do not bring valuables to the hospital. [facility] IS NOT             RESPONSIBLE   FOR VALUABLES.   Contacts, glasses, or bridgework may not be worn into surgery.   Bring small overnight bag day of surgery.   DO NOT BRING YOUR HOME MEDICATIONS TO THE HOSPITAL. PHARMACY WILL DISPENSE MEDICATIONS LISTED ON YOUR MEDICATION LIST TO YOU DURING YOUR ADMISSION IN THE HOSPITAL!    Patients discharged on the day of surgery will not be allowed to drive home.  Someone NEEDS to stay with you for the first 24 hours after anesthesia.   Special Instructions: Bring a copy of your healthcare power of attorney and living will documents         the day of surgery if you haven't scanned them before.              Please read over the following fact sheets you were given: IF YOU HAVE QUESTIONS ABOUT YOUR PRE-OP INSTRUCTIONS PLEASE CALL [phone]       Pre-operative 5 CHG Bath Instructions   You can play a key role in reducing the risk of infection after surgery. Your skin needs to be as free of germs as possible. You can reduce the number of germs on your skin by washing with CHG (chlorhexidine gluconate) soap before surgery. CHG is an antiseptic soap that kills germs and continues to kill germs even after washing.   DO NOT use if you have an allergy to chlorhexidine/CHG or antibacterial soaps. If your skin becomes reddened or irritated, stop using the CHG and notify one of our RNs at : [phone].   Please shower with the CHG soap starting 4 days before surgery using the following schedule:     Please keep in mind the following:  DO NOT shave, including legs and underarms, starting the day of your first shower.   You may shave your face at any point before/day of surgery.  Place clean sheets on your bed the day you start using CHG soap. Use a clean washcloth (not used since being washed) for each shower. DO NOT sleep with pets once you start using the CHG.   CHG Shower Instructions:  If you choose to wash your hair and private area, wash first with your normal shampoo/soap.  After you use shampoo/soap, rinse your hair and body thoroughly to remove shampoo/soap residue.  Turn the water OFF and apply about 3 tablespoons (45 ml) of CHG soap to a CLEAN washcloth.  Apply CHG  soap ONLY FROM YOUR NECK DOWN TO YOUR TOES (washing for 3-5 minutes)  DO NOT use CHG soap on face, private areas, open wounds, or sores.  Pay special attention to the area where your surgery is being performed.  If you are having back surgery, having someone wash your back for you may be helpful. Wait 2 minutes after CHG soap is applied, then you may rinse off the CHG soap.  Pat dry with a clean towel  Put on clean clothes/pajamas   If you choose to wear lotion, please use ONLY the CHG-compatible lotions on the back of this paper.     Additional instructions  for the day of surgery: DO NOT APPLY any lotions, deodorants, cologne, or perfumes.   Put on clean/comfortable clothes.  Brush your teeth.  Ask your nurse before applying any prescription medications to the skin.   CHG Compatible Lotions   Aveeno Moisturizing lotion  Cetaphil Moisturizing Cream  Cetaphil Moisturizing Lotion  Clairol Herbal Essence Moisturizing Lotion, Dry Skin  Clairol Herbal Essence Moisturizing Lotion, Extra Dry Skin  Clairol Herbal Essence Moisturizing Lotion, Normal Skin  Curel Age Defying Therapeutic Moisturizing Lotion with Alpha Hydroxy  Curel Extreme Care Body Lotion  Curel Soothing Hands Moisturizing Hand Lotion  Curel Therapeutic Moisturizing Cream, Fragrance-Free  Curel Therapeutic Moisturizing Lotion, Fragrance-Free  Curel Therapeutic Moisturizing Lotion, Original Formula  Eucerin Daily Replenishing Lotion  Eucerin Dry Skin Therapy Plus Alpha Hydroxy Crme  Eucerin Dry Skin Therapy Plus Alpha Hydroxy Lotion  Eucerin Original Crme  Eucerin Original Lotion  Eucerin Plus Crme Eucerin Plus Lotion  Eucerin TriLipid Replenishing Lotion  Keri Anti-Bacterial Hand Lotion  Keri Deep Conditioning Original Lotion Dry Skin Formula Softly Scented  Keri Deep Conditioning Original Lotion, Fragrance Free Sensitive Skin Formula  Keri Lotion Fast Absorbing Fragrance Free Sensitive Skin Formula  Keri Lotion Fast Absorbing Softly Scented Dry Skin Formula  Keri Original Lotion  Keri Skin Renewal Lotion Keri Silky Smooth Lotion  Keri Silky Smooth Sensitive Skin Lotion  Nivea Body Creamy Conditioning Oil  Nivea Body Extra Enriched Lotion  Nivea Body Original Lotion  Nivea Body Sheer Moisturizing Lotion Nivea Crme  Nivea Skin Firming Lotion  NutraDerm 30 Skin Lotion  NutraDerm Skin Lotion  NutraDerm Therapeutic Skin Cream  NutraDerm Therapeutic Skin Lotion  ProShield Protective Hand Cream  Provon moisturizing lotion   Incentive Spirometer  An incentive  spirometer is a tool that can help keep your lungs clear and active. This tool measures how well you are filling your lungs with each breath. Taking long deep breaths may help reverse or decrease the chance of developing breathing (pulmonary) problems (especially infection) following: A long period of time when you are unable to move or be active. BEFORE THE PROCEDURE  If the spirometer includes an indicator to show your best effort, your nurse or respiratory therapist will set it to a desired goal. If possible, sit up straight or lean slightly forward. Try not to slouch. Hold the incentive spirometer in an upright position. INSTRUCTIONS FOR USE  Sit on the edge of your bed if possible, or sit up as far as you can in bed or on a chair. Hold the incentive spirometer in an upright position. Breathe out normally. Place the mouthpiece in your mouth and seal your lips tightly around it. Breathe in slowly and as deeply as possible, raising the piston or the ball toward the top of the column. Hold your breath for 3-5 seconds or for as long as possible. Allow  the piston or ball to fall to the bottom of the column. Remove the mouthpiece from your mouth and breathe out normally. Rest for a few seconds and repeat Steps 1 through 7 at least 10 times every 1-2 hours when you are awake. Take your time and take a few normal breaths between deep breaths. The spirometer may include an indicator to show your best effort. Use the indicator as a goal to work toward during each repetition. After each set of 10 deep breaths, practice coughing to be sure your lungs are clear. If you have an incision (the cut made at the time of surgery), support your incision when coughing by placing a pillow or rolled up towels firmly against it. Once you are able to get out of bed, walk around indoors and cough well. You may stop using the incentive spirometer when instructed by your caregiver.  RISKS AND COMPLICATIONS Take your time  so you do not get dizzy or light-headed. If you are in pain, you may need to take or ask for pain medication before doing incentive spirometry. It is harder to take a deep breath if you are having pain. AFTER USE Rest and breathe slowly and easily. It can be helpful to keep track of a log of your progress. Your caregiver can provide you with a simple table to help with this. If you are using the spirometer at home, follow these instructions: SEEK MEDICAL CARE IF:  You are having difficultly using the spirometer. You have trouble using the spirometer as often as instructed. Your pain medication is not giving enough relief while using the spirometer. You develop fever of 100.5 F (38.1 C) or higher. SEEK IMMEDIATE MEDICAL CARE IF:  You cough up bloody sputum that had not been present before. You develop fever of 102 F (38.9 C) or greater. You develop worsening pain at or near the incision site. MAKE SURE YOU:  Understand these instructions. Will watch your condition. Will get help right away if you are not doing well or get worse. Document Released: 08/20/2006 Document Revised: 07/02/2011 Document Reviewed: 10/21/2006 [facility] Patient Information 2014 [place], [place].   ________________________________________________________________________

## 2023-05-29 ENCOUNTER — Encounter (HOSPITAL_COMMUNITY): Payer: Self-pay

## 2023-05-29 ENCOUNTER — Other Ambulatory Visit: Payer: Self-pay

## 2023-05-29 ENCOUNTER — Encounter (HOSPITAL_COMMUNITY)
Admission: RE | Admit: 2023-05-29 | Discharge: 2023-05-29 | Disposition: A | Discharge status: Home / Self Care | Payer: Medicare Other | Source: Ambulatory Visit | Attending: Orthopedic Surgery | Admitting: Orthopedic Surgery

## 2023-05-29 VITALS — BP 145/79 | HR 61 | Temp 98.0°F | Ht 63.0 in | Wt 161.0 lb

## 2023-05-29 DIAGNOSIS — Z01812 Encounter for preprocedural laboratory examination: Secondary | ICD-10-CM | POA: Diagnosis present

## 2023-05-29 DIAGNOSIS — R9431 Abnormal electrocardiogram [ECG] [EKG]: Secondary | ICD-10-CM | POA: Diagnosis not present

## 2023-05-29 DIAGNOSIS — I10 Essential (primary) hypertension: Secondary | ICD-10-CM | POA: Insufficient documentation

## 2023-05-29 DIAGNOSIS — Z01818 Encounter for other preprocedural examination: Secondary | ICD-10-CM | POA: Insufficient documentation

## 2023-05-29 DIAGNOSIS — M1611 Unilateral primary osteoarthritis, right hip: Secondary | ICD-10-CM | POA: Insufficient documentation

## 2023-05-29 DIAGNOSIS — Z01810 Encounter for preprocedural cardiovascular examination: Secondary | ICD-10-CM | POA: Diagnosis present

## 2023-05-29 HISTORY — DX: Unspecified osteoarthritis, unspecified site: M19.90

## 2023-05-29 LAB — BASIC METABOLIC PANEL
Anion gap: 9 (ref 5–15)
BUN: 10 mg/dL (ref 8–23)
CO2: 24 mmol/L (ref 22–32)
Calcium: 9.2 mg/dL (ref 8.9–10.3)
Chloride: 102 mmol/L (ref 98–111)
Creatinine, Ser: 0.74 mg/dL (ref 0.44–1.00)
GFR, Estimated: >60 mL/min (ref >60)
Glucose, Bld: 104 mg/dL — ABNORMAL HIGH (ref 70–99)
Potassium: 4.4 mmol/L (ref 3.5–5.1)
Sodium: 135 mmol/L (ref 135–145)

## 2023-05-29 LAB — SURGICAL PCR SCREEN
MRSA, PCR: NEGATIVE
Staphylococcus aureus: NEGATIVE

## 2023-05-29 NOTE — Progress Notes (Signed)
 For Anesthesia: PCP - Corean Ade, MD . ARNETTA: 05/15/23 Cardiologist - N/A  Bowel Prep reminder:  Chest x-ray -  EKG - 05/29/23 Stress Test -  ECHO -  Cardiac Cath -  Pacemaker/ICD device last checked: Pacemaker orders received: Device Rep notified:  Spinal Cord Stimulator:N/A  Sleep Study - N/A CPAP -   Fasting Blood Sugar - N/A Checks Blood Sugar _____ times a day Date and result of last Hgb A1c-  Last dose of GLP1 agonist- N/A GLP1 instructions:   Last dose of SGLT-2 inhibitors- N/A SGLT-2 instructions:   Blood Thinner Instructions: Aspirin Instructions:On hold already. Last Dose:  Activity level: Can go up a flight of stairs and activities of daily living without stopping and without chest pain and/or shortness of breath   Able to exercise without chest pain and/or shortness of breath     Anesthesia review: Hx: Smoker,HTN  Patient denies shortness of breath, fever, cough and chest pain at PAT appointment   Patient verbalized understanding of instructions that were given to them at the PAT appointment. Patient was also instructed that they will need to review over the PAT instructions again at home before surgery.

## 2023-05-31 NOTE — H&P (Deleted)
 TOTAL HIP ADMISSION H&P  Patient is admitted for right total hip arthroplasty.  Therapy Plans: outpatient therapy at [facility] Disposition: Home with husband Planned DVT Prophylaxis: aspirin 81mg  BID DME needed: none PCP: Dr. Aisha - clearance received TXA: IV Allergies: PCN - hives Anesthesia Concerns: none BMI: 23.2 Last HgbA1c: Not diabetic   Other: - Hx of lumbar surgery at [facility], spinal stenosis - having some issues again with bilateral buttock pain - No hx of VTE - Staying overnight - History of left PFA - did well - oxycodone , tylenol , robaxin , meloxicam    Subjective:  Chief Complaint: right hip pain  HPI: Amber Tate, 68 y.o. female, has a history of pain and functional disability in the right hip(s) due to arthritis and patient has failed non-surgical conservative treatments for greater than 12 weeks to include NSAID's and/or analgesics and activity modification.  Onset of symptoms was gradual starting 2 years ago with gradually worsening course since that time.The patient noted no past surgery on the right hip(s).  Patient currently rates pain in the right hip at 8 out of 10 with activity. Patient has worsening of pain with activity and weight bearing, pain that interfers with activities of daily living, and pain with passive range of motion. Patient has evidence of joint space narrowing by imaging studies. This condition presents safety issues increasing the risk of falls. There is no current active infection.  There are no active problems to display for this patient.  Past Medical History:  Diagnosis Date   Arthritis    Hyperlipemia    Hypertension    Seasonal allergies     Past Surgical History:  Procedure Laterality Date   ABDOMINAL HYSTERECTOMY     APPENDECTOMY     BLADDER REPAIR     COLONOSCOPY     INNER EAR SURGERY Bilateral    TONSILLECTOMY      No current facility-administered medications for this encounter.   Current Outpatient Medications   Medication Sig Dispense Refill Last Dose/Taking   albuterol (VENTOLIN HFA) 108 (90 Base) MCG/ACT inhaler Inhale 1-2 puffs into the lungs every 6 (six) hours as needed for wheezing or shortness of breath.   Taking As Needed   aspirin EC 81 MG tablet Take 81 mg by mouth in the morning.   Taking   BIOTIN PO Take 1 capsule by mouth in the morning.   Taking   Calcium Carbonate-Vit D-Min (CALCIUM 1200 PO) Take 2 tablets by mouth in the morning.   Taking   cetirizine (ZYRTEC) 10 MG tablet Take 10 mg by mouth in the morning.   Taking   cholecalciferol (VITAMIN D3) 25 MCG (1000 UNIT) tablet Take 1,000 Units by mouth in the morning.   Taking   diclofenac (VOLTAREN) 75 MG EC tablet Take 75 mg by mouth 2 (two) times daily as needed (inflammation/pain.).   Taking As Needed   estradiol (ESTRACE) 0.1 MG/GM vaginal cream Place 1 Applicatorful vaginally 3 (three) times a week. Apply a pea-sized amount 3 times weekly   Taking   losartan-hydrochlorothiazide (HYZAAR) 100-12.5 MG tablet Take 1 tablet by mouth in the morning.   Taking   Misc Natural Products (CYSTEX URINARY HEALTH) LIQD Take 15 mLs by mouth in the morning.   Taking   Multiple Vitamin (MULTIVITAMIN WITH MINERALS) TABS tablet Take 1 tablet by mouth in the morning.   Taking   Omega-3 Fatty Acids (OMEGA-3 PO) Take 1 capsule by mouth at bedtime.   Taking   omeprazole (PRILOSEC) 20 MG  capsule Take 20 mg by mouth daily before breakfast.   Taking   rosuvastatin (CRESTOR) 10 MG tablet Take 10 mg by mouth every evening.   Taking   vitamin E 400 UNIT capsule Take 400 Units by mouth in the morning.   Taking   Allergies  Allergen Reactions   Ivp Dye [Iodinated Contrast Media] Anaphylaxis, Hives and Rash    Social History   Tobacco Use   Smoking status: Every Day    Current packs/day: 0.50    Types: Cigarettes   Smokeless tobacco: Never  Substance Use Topics   Alcohol use: Not Currently    Family History  Problem Relation Age of Onset   Diabetes  Mother    Stroke Mother      Review of Systems  Constitutional:  Negative for chills and fever.  Respiratory:  Negative for cough and shortness of breath.   Cardiovascular:  Negative for chest pain.  Gastrointestinal:  Negative for nausea and vomiting.  Musculoskeletal:  Positive for arthralgias.     Objective:  Physical Exam BMI noted to be 21.4 Very pleasant healthy 68 year old female awake alert and oriented. She is in no acute distress. She walks without assist device. No significant limp observed.  Right knee exam: No palpable effusion, warmth erythema No significant flexion contracture with flexion close to 120 degrees with tightness and crepitation and grinding anteriorly No significant joint line tenderness  Left knee exam reveals well-healed anterior based incision with full knee extension and flexion to 120 degrees without pain or mechanical symptoms  Vital signs in last 24 hours:    Labs:   Estimated body mass index is 28.52 kg/m as calculated from the following:   Height as of 05/29/23: 5' 3 (1.6 m).   Weight as of 05/29/23: 73 kg.   Imaging Review Plain radiographs demonstrate severe degenerative joint disease of the right hip(s). The bone quality appears to be adequate for age and reported activity level.      Assessment/Plan:  End stage arthritis, right hip(s)  The patient history, physical examination, clinical judgement of the provider and imaging studies are consistent with end stage degenerative joint disease of the right hip(s) and total hip arthroplasty is deemed medically necessary. The treatment options including medical management, injection therapy, arthroscopy and arthroplasty were discussed at length. The risks and benefits of total hip arthroplasty were presented and reviewed. The risks due to aseptic loosening, infection, stiffness, dislocation/subluxation,  thromboembolic complications and other imponderables were discussed.  The patient  acknowledged the explanation, agreed to proceed with the plan and consent was signed. Patient is being admitted for inpatient treatment for surgery, pain control, PT, OT, prophylactic antibiotics, VTE prophylaxis, progressive ambulation and ADL's and discharge planning.The patient is planning to be discharged  home.  Rosina Calin, PA-C Orthopedic Surgery [facility] [place] [phone]

## 2023-05-31 NOTE — H&P (Signed)
 TOTAL HIP ADMISSION H&P  Patient is admitted for right total hip arthroplasty.  Therapy Plans: [facility] Disposition: Home with husband Planned DVT Prophylaxis: aspirin 81mg  BID DME needed: walker PCP: Asberry Gals - clearance received TXA: IV Allergies: omnipaque - hives Anesthesia Concerns: none BMI: 28.9 Last HgbA1c: Not diabetic   Other: - Very anxious - SDD - send meds ahead - GI mini colonoscopy next week - recent blood in stools, though to be hemorrhoidal - oxycodone  (14), tylenol , celebrex/meloxicam , robaxin  - Smoking - has reduced to 4-5/day  Subjective:  Chief Complaint: right hip pain  HPI: Amber Tate, 68 y.o. female, has a history of pain and functional disability in the right hip(s) due to arthritis and patient has failed non-surgical conservative treatments for greater than 12 weeks to include NSAID's and/or analgesics and activity modification.  Onset of symptoms was gradual starting 2 years ago with gradually worsening course since that time.The patient noted no past surgery on the right hip(s).  Patient currently rates pain in the right hip at 8 out of 10 with activity. Patient has worsening of pain with activity and weight bearing, pain that interfers with activities of daily living, and pain with passive range of motion. Patient has evidence of joint space narrowing by imaging studies. This condition presents safety issues increasing the risk of falls. There is no current active infection.  There are no active problems to display for this patient.  Past Medical History:  Diagnosis Date   Arthritis    Hyperlipemia    Hypertension    Seasonal allergies     Past Surgical History:  Procedure Laterality Date   ABDOMINAL HYSTERECTOMY     APPENDECTOMY     BLADDER REPAIR     COLONOSCOPY     INNER EAR SURGERY Bilateral    TONSILLECTOMY      No current facility-administered medications for this encounter.   Current Outpatient Medications  Medication Sig  Dispense Refill Last Dose/Taking   albuterol (VENTOLIN HFA) 108 (90 Base) MCG/ACT inhaler Inhale 1-2 puffs into the lungs every 6 (six) hours as needed for wheezing or shortness of breath.   Taking As Needed   aspirin EC 81 MG tablet Take 81 mg by mouth in the morning.   Taking   BIOTIN PO Take 1 capsule by mouth in the morning.   Taking   Calcium Carbonate-Vit D-Min (CALCIUM 1200 PO) Take 2 tablets by mouth in the morning.   Taking   cetirizine (ZYRTEC) 10 MG tablet Take 10 mg by mouth in the morning.   Taking   cholecalciferol (VITAMIN D3) 25 MCG (1000 UNIT) tablet Take 1,000 Units by mouth in the morning.   Taking   diclofenac (VOLTAREN) 75 MG EC tablet Take 75 mg by mouth 2 (two) times daily as needed (inflammation/pain.).   Taking As Needed   estradiol (ESTRACE) 0.1 MG/GM vaginal cream Place 1 Applicatorful vaginally 3 (three) times a week. Apply a pea-sized amount 3 times weekly   Taking   losartan-hydrochlorothiazide (HYZAAR) 100-12.5 MG tablet Take 1 tablet by mouth in the morning.   Taking   Misc Natural Products (CYSTEX URINARY HEALTH) LIQD Take 15 mLs by mouth in the morning.   Taking   Multiple Vitamin (MULTIVITAMIN WITH MINERALS) TABS tablet Take 1 tablet by mouth in the morning.   Taking   Omega-3 Fatty Acids (OMEGA-3 PO) Take 1 capsule by mouth at bedtime.   Taking   omeprazole (PRILOSEC) 20 MG capsule Take 20 mg by mouth  daily before breakfast.   Taking   rosuvastatin (CRESTOR) 10 MG tablet Take 10 mg by mouth every evening.   Taking   vitamin E 400 UNIT capsule Take 400 Units by mouth in the morning.   Taking   Allergies  Allergen Reactions   Ivp Dye [Iodinated Contrast Media] Anaphylaxis, Hives and Rash    Social History   Tobacco Use   Smoking status: Every Day    Current packs/day: 0.50    Types: Cigarettes   Smokeless tobacco: Never  Substance Use Topics   Alcohol use: Not Currently    Family History  Problem Relation Age of Onset   Diabetes Mother    Stroke  Mother      Review of Systems  Constitutional:  Negative for chills and fever.  Respiratory:  Negative for cough and shortness of breath.   Cardiovascular:  Negative for chest pain.  Gastrointestinal:  Negative for nausea and vomiting.  Musculoskeletal:  Positive for arthralgias.     Objective:  Physical Exam Well nourished and well developed. General: Alert and oriented x3, cooperative and pleasant, no acute distress. Head: normocephalic, atraumatic, neck supple. Eyes: EOMI.  Musculoskeletal: Bilateral hip exam: Right hip range of motion is limited with hip flexion internal rotation 5 degrees with pelvic tilting and stiffness with reproducible groin pain External rotation on the right to about 20 degrees Slight external rotation contracture with active hip flexion with some weakness associated with the pain  Left hip exam: Mild stiffness with mild discomfort over the anterior aspect the hip with hip flexion internal rotation close to 20 degrees with external rotation of 30 degrees She is neurovascular intact distally  Calves soft and nontender. Motor function intact in LE. Strength 5/5 LE bilaterally. Neuro: Distal pulses 2+. Sensation to light touch intact in LE.  Vital signs in last 24 hours:    Labs:   Estimated body mass index is 28.52 kg/m as calculated from the following:   Height as of 05/29/23: 5' 3 (1.6 m).   Weight as of 05/29/23: 73 kg.   Imaging Review Plain radiographs demonstrate severe degenerative joint disease of the right hip(s). The bone quality appears to be adequate for age and reported activity level.      Assessment/Plan:  End stage arthritis, right hip(s)  The patient history, physical examination, clinical judgement of the provider and imaging studies are consistent with end stage degenerative joint disease of the right hip(s) and total hip arthroplasty is deemed medically necessary. The treatment options including medical management,  injection therapy, arthroscopy and arthroplasty were discussed at length. The risks and benefits of total hip arthroplasty were presented and reviewed. The risks due to aseptic loosening, infection, stiffness, dislocation/subluxation,  thromboembolic complications and other imponderables were discussed.  The patient acknowledged the explanation, agreed to proceed with the plan and consent was signed. Patient is being admitted for inpatient treatment for surgery, pain control, PT, OT, prophylactic antibiotics, VTE prophylaxis, progressive ambulation and ADL's and discharge planning.The patient is planning to be discharged home.   Rosina Calin, PA-C Orthopedic Surgery [facility] [place] [phone]

## 2023-06-04 ENCOUNTER — Ambulatory Visit (HOSPITAL_COMMUNITY)
Admission: RE | Admit: 2023-06-04 | Discharge: 2023-06-04 | Disposition: A | Discharge status: Home / Self Care | Payer: Medicare Other | Attending: Orthopedic Surgery | Admitting: Orthopedic Surgery

## 2023-06-04 ENCOUNTER — Other Ambulatory Visit: Payer: Self-pay

## 2023-06-04 ENCOUNTER — Ambulatory Visit (HOSPITAL_COMMUNITY): Payer: Medicare Other | Admitting: Certified Registered Nurse Anesthetist

## 2023-06-04 ENCOUNTER — Ambulatory Visit (HOSPITAL_COMMUNITY): Payer: Medicare Other

## 2023-06-04 ENCOUNTER — Encounter (HOSPITAL_COMMUNITY)
Admission: RE | Disposition: A | Discharge status: Home / Self Care | Payer: Self-pay | Source: Home / Self Care | Attending: Orthopedic Surgery

## 2023-06-04 ENCOUNTER — Encounter (HOSPITAL_COMMUNITY): Payer: Self-pay | Admitting: Orthopedic Surgery

## 2023-06-04 DIAGNOSIS — M1611 Unilateral primary osteoarthritis, right hip: Secondary | ICD-10-CM | POA: Diagnosis present

## 2023-06-04 DIAGNOSIS — F17210 Nicotine dependence, cigarettes, uncomplicated: Secondary | ICD-10-CM | POA: Insufficient documentation

## 2023-06-04 DIAGNOSIS — Z79899 Other long term (current) drug therapy: Secondary | ICD-10-CM | POA: Diagnosis not present

## 2023-06-04 DIAGNOSIS — K219 Gastro-esophageal reflux disease without esophagitis: Secondary | ICD-10-CM | POA: Insufficient documentation

## 2023-06-04 DIAGNOSIS — J449 Chronic obstructive pulmonary disease, unspecified: Secondary | ICD-10-CM | POA: Diagnosis not present

## 2023-06-04 DIAGNOSIS — Z96641 Presence of right artificial hip joint: Secondary | ICD-10-CM

## 2023-06-04 DIAGNOSIS — I10 Essential (primary) hypertension: Secondary | ICD-10-CM | POA: Insufficient documentation

## 2023-06-04 HISTORY — PX: TOTAL HIP ARTHROPLASTY: SHX124

## 2023-06-04 LAB — TYPE AND SCREEN
ABO/RH(D): A POS
Antibody Screen: NEGATIVE

## 2023-06-04 LAB — ABO/RH: ABO/RH(D): A POS

## 2023-06-04 SURGERY — ARTHROPLASTY, HIP, TOTAL, ANTERIOR APPROACH
Anesthesia: Spinal | Site: Hip | Laterality: Right

## 2023-06-04 MED ORDER — METHOCARBAMOL 1000 MG/10ML IJ SOLN: 500.0000 mg | Freq: Four times a day (QID) | INTRAMUSCULAR | Status: DC | PRN

## 2023-06-04 MED ORDER — BUPIVACAINE-EPINEPHRINE 0.25% -1:200000 IJ SOLN
INTRAMUSCULAR | Status: AC
  Filled 2023-06-04: qty 1

## 2023-06-04 MED ORDER — METHOCARBAMOL 500 MG PO TABS
500.0000 mg | ORAL_TABLET | Freq: Four times a day (QID) | ORAL | Status: DC | PRN
  Administered 2023-06-04: 500 mg via ORAL

## 2023-06-04 MED ORDER — MIDAZOLAM HCL 2 MG/2ML IJ SOLN
INTRAMUSCULAR | Status: DC | PRN
  Administered 2023-06-04: 2 mg via INTRAVENOUS

## 2023-06-04 MED ORDER — PROPOFOL 1000 MG/100ML IV EMUL
INTRAVENOUS | Status: AC
  Filled 2023-06-04: qty 200

## 2023-06-04 MED ORDER — SODIUM CHLORIDE (PF) 0.9 % IJ SOLN
INTRAMUSCULAR | Status: DC | PRN
  Administered 2023-06-04: 61 mL

## 2023-06-04 MED ORDER — EPHEDRINE SULFATE-NACL 50-0.9 MG/10ML-% IV SOSY
PREFILLED_SYRINGE | INTRAVENOUS | Status: DC | PRN
Start: 2023-06-04 — End: 2023-06-04
  Administered 2023-06-04: 10 mg via INTRAVENOUS

## 2023-06-04 MED ORDER — SENNA 8.6 MG PO TABS: 1.0000 | ORAL_TABLET | Freq: Every day | ORAL | 0 refills | Status: AC

## 2023-06-04 MED ORDER — OXYCODONE HCL 5 MG PO TABS: 5.0000 mg | ORAL_TABLET | ORAL | 0 refills | Status: DC | PRN

## 2023-06-04 MED ORDER — PROPOFOL 500 MG/50ML IV EMUL
INTRAVENOUS | Status: DC | PRN
  Administered 2023-06-04: 40 mg via INTRAVENOUS
  Administered 2023-06-04: 75 ug/kg/min via INTRAVENOUS

## 2023-06-04 MED ORDER — ACETAMINOPHEN 500 MG PO TABS
1000.0000 mg | ORAL_TABLET | Freq: Four times a day (QID) | ORAL | Status: DC
  Administered 2023-06-04: 1000 mg via ORAL

## 2023-06-04 MED ORDER — EPHEDRINE 5 MG/ML INJ
INTRAVENOUS | Status: AC
  Filled 2023-06-04: qty 10

## 2023-06-04 MED ORDER — POLYETHYLENE GLYCOL 3350 17 G PO PACK: 17.0000 g | PACK | Freq: Two times a day (BID) | ORAL | Status: AC

## 2023-06-04 MED ORDER — MELOXICAM 15 MG PO TABS: 15.0000 mg | ORAL_TABLET | Freq: Every day | ORAL | 2 refills | Status: AC

## 2023-06-04 MED ORDER — CHLORHEXIDINE GLUCONATE 0.12 % MT SOLN
15.0000 mL | Freq: Once | OROMUCOSAL | Status: AC
  Administered 2023-06-04: 15 mL via OROMUCOSAL

## 2023-06-04 MED ORDER — FENTANYL CITRATE (PF) 100 MCG/2ML IJ SOLN
INTRAMUSCULAR | Status: AC
Start: 2023-06-04 — End: ?
  Filled 2023-06-04: qty 2

## 2023-06-04 MED ORDER — TRANEXAMIC ACID-NACL 1000-0.7 MG/100ML-% IV SOLN
1000.0000 mg | INTRAVENOUS | Status: AC
  Administered 2023-06-04: 1000 mg via INTRAVENOUS
  Filled 2023-06-04: qty 100

## 2023-06-04 MED ORDER — ACETAMINOPHEN 500 MG PO TABS
1000.0000 mg | ORAL_TABLET | Freq: Once | ORAL | Status: AC
  Administered 2023-06-04: 1000 mg via ORAL
  Filled 2023-06-04: qty 2

## 2023-06-04 MED ORDER — AMISULPRIDE (ANTIEMETIC) 5 MG/2ML IV SOLN: 10.0000 mg | Freq: Once | INTRAVENOUS | Status: DC | PRN

## 2023-06-04 MED ORDER — LACTATED RINGERS IV SOLN: INTRAVENOUS | Status: DC

## 2023-06-04 MED ORDER — ACETAMINOPHEN 500 MG PO TABS
ORAL_TABLET | ORAL | Status: AC
  Filled 2023-06-04: qty 2

## 2023-06-04 MED ORDER — CEFAZOLIN SODIUM-DEXTROSE 2-4 GM/100ML-% IV SOLN
INTRAVENOUS | Status: AC
  Filled 2023-06-04: qty 100

## 2023-06-04 MED ORDER — SODIUM CHLORIDE 0.9% FLUSH
3.0000 mL | INTRAVENOUS | Status: DC | PRN
Start: 2023-06-04 — End: 2023-06-04

## 2023-06-04 MED ORDER — FENTANYL CITRATE (PF) 100 MCG/2ML IJ SOLN
INTRAMUSCULAR | Status: DC | PRN
  Administered 2023-06-04: 50 ug via INTRAVENOUS

## 2023-06-04 MED ORDER — ORAL CARE MOUTH RINSE: 15.0000 mL | Freq: Once | OROMUCOSAL | Status: AC

## 2023-06-04 MED ORDER — DEXAMETHASONE SODIUM PHOSPHATE 10 MG/ML IJ SOLN: 8.0000 mg | Freq: Once | INTRAMUSCULAR | Status: DC

## 2023-06-04 MED ORDER — PHENYLEPHRINE 80 MCG/ML (10ML) SYRINGE FOR IV PUSH (FOR BLOOD PRESSURE SUPPORT)
PREFILLED_SYRINGE | INTRAVENOUS | Status: DC | PRN
  Administered 2023-06-04 (×3): 160 ug via INTRAVENOUS

## 2023-06-04 MED ORDER — BUPIVACAINE IN DEXTROSE 0.75-8.25 % IT SOLN
INTRATHECAL | Status: DC | PRN
Start: 2023-06-04 — End: 2023-06-04
  Administered 2023-06-04: 1.8 mL via INTRATHECAL

## 2023-06-04 MED ORDER — SODIUM CHLORIDE (PF) 0.9 % IJ SOLN
INTRAMUSCULAR | Status: AC
  Filled 2023-06-04: qty 30

## 2023-06-04 MED ORDER — 0.9 % SODIUM CHLORIDE (POUR BTL) OPTIME
TOPICAL | Status: DC | PRN
  Administered 2023-06-04: 1000 mL

## 2023-06-04 MED ORDER — TRANEXAMIC ACID-NACL 1000-0.7 MG/100ML-% IV SOLN
1000.0000 mg | Freq: Once | INTRAVENOUS | Status: AC
  Administered 2023-06-04: 1000 mg via INTRAVENOUS

## 2023-06-04 MED ORDER — ONDANSETRON HCL 4 MG/2ML IJ SOLN
INTRAMUSCULAR | Status: DC | PRN
  Administered 2023-06-04: 4 mg via INTRAVENOUS

## 2023-06-04 MED ORDER — DEXAMETHASONE SODIUM PHOSPHATE 10 MG/ML IJ SOLN
INTRAMUSCULAR | Status: DC | PRN
  Administered 2023-06-04: 10 mg via INTRAVENOUS

## 2023-06-04 MED ORDER — MIDAZOLAM HCL 2 MG/2ML IJ SOLN
INTRAMUSCULAR | Status: AC
  Filled 2023-06-04: qty 2

## 2023-06-04 MED ORDER — METHOCARBAMOL 500 MG PO TABS
ORAL_TABLET | ORAL | Status: AC
  Filled 2023-06-04: qty 1

## 2023-06-04 MED ORDER — SODIUM CHLORIDE 0.9% FLUSH: 3.0000 mL | Freq: Two times a day (BID) | INTRAVENOUS | Status: DC

## 2023-06-04 MED ORDER — PROPOFOL 1000 MG/100ML IV EMUL
INTRAVENOUS | Status: AC
  Filled 2023-06-04: qty 100

## 2023-06-04 MED ORDER — METHOCARBAMOL 500 MG PO TABS: 500.0000 mg | ORAL_TABLET | Freq: Four times a day (QID) | ORAL | 1 refills | Status: AC | PRN

## 2023-06-04 MED ORDER — TRANEXAMIC ACID-NACL 1000-0.7 MG/100ML-% IV SOLN
INTRAVENOUS | Status: AC
  Filled 2023-06-04: qty 100

## 2023-06-04 MED ORDER — CEFAZOLIN SODIUM-DEXTROSE 2-4 GM/100ML-% IV SOLN
2.0000 g | Freq: Four times a day (QID) | INTRAVENOUS | Status: DC
  Administered 2023-06-04: 2 g via INTRAVENOUS

## 2023-06-04 MED ORDER — ONDANSETRON HCL 4 MG/2ML IJ SOLN: 4.0000 mg | Freq: Once | INTRAMUSCULAR | Status: DC | PRN

## 2023-06-04 MED ORDER — PHENYLEPHRINE 80 MCG/ML (10ML) SYRINGE FOR IV PUSH (FOR BLOOD PRESSURE SUPPORT)
PREFILLED_SYRINGE | INTRAVENOUS | Status: AC
  Filled 2023-06-04: qty 20

## 2023-06-04 MED ORDER — STERILE WATER FOR IRRIGATION IR SOLN
Status: DC | PRN
  Administered 2023-06-04: 1000 mL

## 2023-06-04 MED ORDER — KETOROLAC TROMETHAMINE 30 MG/ML IJ SOLN
INTRAMUSCULAR | Status: AC
  Filled 2023-06-04: qty 1

## 2023-06-04 MED ORDER — CEFAZOLIN SODIUM-DEXTROSE 2-4 GM/100ML-% IV SOLN
2.0000 g | INTRAVENOUS | Status: AC
  Administered 2023-06-04: 2 g via INTRAVENOUS
  Filled 2023-06-04: qty 100

## 2023-06-04 MED ORDER — POVIDONE-IODINE 10 % EX SWAB
2.0000 | Freq: Once | CUTANEOUS | Status: DC
Start: 2023-06-04 — End: 2023-06-04

## 2023-06-04 MED ORDER — FENTANYL CITRATE PF 50 MCG/ML IJ SOSY: 25.0000 ug | PREFILLED_SYRINGE | INTRAMUSCULAR | Status: DC | PRN

## 2023-06-04 SURGICAL SUPPLY — 39 items
BAG COUNTER SPONGE SURGICOUNT (BAG) IMPLANT
BAG ZIPLOCK 12X15 (MISCELLANEOUS) IMPLANT
BLADE SAG 18X100X1.27 (BLADE) ×1 IMPLANT
COVER PERINEAL POST (MISCELLANEOUS) ×1 IMPLANT
COVER SURGICAL LIGHT HANDLE (MISCELLANEOUS) ×1 IMPLANT
CUP ACET PINNACLE SECTR 56MM (Hips) IMPLANT
DERMABOND ADVANCED .7 DNX12 (GAUZE/BANDAGES/DRESSINGS) ×1 IMPLANT
DRAPE FOOT SWITCH (DRAPES) ×1 IMPLANT
DRAPE STERI IOBAN 125X83 (DRAPES) ×1 IMPLANT
DRAPE U-SHAPE 47X51 STRL (DRAPES) ×2 IMPLANT
DRESSING AQUACEL AG SP 3.5X10 (GAUZE/BANDAGES/DRESSINGS) ×1 IMPLANT
DRSG AQUACEL AG SP 3.5X10 (GAUZE/BANDAGES/DRESSINGS) ×1
DURAPREP 26ML APPLICATOR (WOUND CARE) ×1 IMPLANT
ELECT REM PT RETURN 15FT ADLT (MISCELLANEOUS) ×1 IMPLANT
GLOVE BIO SURGEON STRL SZ 6 (GLOVE) ×1 IMPLANT
GLOVE BIOGEL PI IND STRL 6.5 (GLOVE) ×1 IMPLANT
GLOVE BIOGEL PI IND STRL 7.5 (GLOVE) ×1 IMPLANT
GLOVE ORTHO TXT STRL SZ7.5 (GLOVE) ×2 IMPLANT
GOWN STRL REUS W/ TWL LRG LVL3 (GOWN DISPOSABLE) ×2 IMPLANT
HEAD CERAMIC DELTA 36 PLUS 1.5 (Hips) IMPLANT
HOLDER FOLEY CATH W/STRAP (MISCELLANEOUS) ×1 IMPLANT
KIT TURNOVER KIT A (KITS) IMPLANT
MANIFOLD NEPTUNE II (INSTRUMENTS) ×1 IMPLANT
NDL SAFETY ECLIPSE 18X1.5 (NEEDLE) IMPLANT
PACK ANTERIOR HIP CUSTOM (KITS) ×1 IMPLANT
PINNACLE ALTRX PLUS 4 N 36X56 (Hips) IMPLANT
PINNACLE SECTOR CUP 56MM (Hips) ×1 IMPLANT
SCREW 6.5MMX35MM (Screw) IMPLANT
STEM FEMORAL SZ5 HIGH ACTIS (Stem) IMPLANT
SUT MNCRL AB 4-0 PS2 18 (SUTURE) ×1 IMPLANT
SUT STRATAFIX 0 PDS 27 VIOLET (SUTURE) ×1
SUT VIC AB 1 CT1 36 (SUTURE) ×3 IMPLANT
SUT VIC AB 2-0 CT1 TAPERPNT 27 (SUTURE) ×2 IMPLANT
SUTURE STRATFX 0 PDS 27 VIOLET (SUTURE) ×1 IMPLANT
SYR 3ML LL SCALE MARK (SYRINGE) IMPLANT
TOWEL GREEN STERILE FF (TOWEL DISPOSABLE) ×1 IMPLANT
TRAY FOLEY MTR SLVR 16FR STAT (SET/KITS/TRAYS/PACK) ×1 IMPLANT
TUBE SUCTION HIGH CAP CLEAR NV (SUCTIONS) ×1 IMPLANT
WATER STERILE IRR 1000ML POUR (IV SOLUTION) ×1 IMPLANT

## 2023-06-04 NOTE — Transfer of Care (Signed)
Immediate Anesthesia Transfer of Care Note  Patient: Amber Tate  Procedure(s) Performed: Procedure(s): RIGHT TOTAL HIP ARTHROPLASTY ANTERIOR APPROACH (Right)  Patient Location: PACU  Anesthesia Type:Spinal  Level of Consciousness: awake, alert  and oriented  Airway & Oxygen Therapy: Patient Spontanous Breathing  Post-op Assessment: Report given to RN and Post -op Vital signs reviewed and stable  Post vital signs: Reviewed and stable  Last Vitals:  Vitals:   06/04/23 0609 06/04/23 1010  BP: 127/77 (!) 90/58  Pulse: 67 75  Resp: 16 19  Temp: 36.8 C (!) 35.8 C  SpO2: 95% 93%    Complications: No apparent anesthesia complications

## 2023-06-04 NOTE — Anesthesia Procedure Notes (Signed)
Spinal  Patient location during procedure: OR Start time: 06/04/2023 8:40 AM End time: 06/04/2023 8:45 AM Reason for block: surgical anesthesia Staffing Performed: anesthesiologist  Anesthesiologist: Leonides Grills, MD Performed by: Leonides Grills, MD Authorized by: Leonides Grills, MD   Preanesthetic Checklist Completed: patient identified, IV checked, risks and benefits discussed, surgical consent, monitors and equipment checked, pre-op evaluation and timeout performed Spinal Block Patient position: sitting Prep: DuraPrep Patient monitoring: cardiac monitor, continuous pulse ox and blood pressure Approach: midline Location: L4-5 Injection technique: single-shot Needle Needle type: Pencan  Needle gauge: 24 G Needle length: 9 cm Assessment Sensory level: T10 Events: CSF return Additional Notes Functioning IV was confirmed and monitors were applied. Sterile prep and drape, including hand hygiene and sterile gloves were used. The patient was positioned and the spine was prepped. The skin was anesthetized with lidocaine.  Free flow of clear CSF was obtained prior to injecting local anesthetic into the CSF.  The spinal needle aspirated freely following injection.  The needle was carefully withdrawn.  The patient tolerated the procedure well.

## 2023-06-04 NOTE — Anesthesia Preprocedure Evaluation (Addendum)
Anesthesia Evaluation  Patient identified by MRN, date of birth, ID band Patient awake    Reviewed: Allergy & Precautions, NPO status , Patient's Chart, lab work & pertinent test results  Airway Mallampati: II  TM Distance: >3 FB Neck ROM: Full    Dental  (+) Edentulous Upper, Edentulous Lower   Pulmonary COPD,  COPD inhaler, Current Smoker and Patient abstained from smoking.   Pulmonary exam normal        Cardiovascular hypertension, Pt. on medications Normal cardiovascular exam     Neuro/Psych negative neurological ROS  negative psych ROS   GI/Hepatic Neg liver ROS,GERD  Medicated and Controlled,,  Endo/Other  negative endocrine ROS    Renal/GU negative Renal ROS     Musculoskeletal  (+) Arthritis ,    Abdominal   Peds  Hematology negative hematology ROS (+)   Anesthesia Other Findings Right hip osteoarthritis  Reproductive/Obstetrics                              Anesthesia Physical Anesthesia Plan  ASA: 3  Anesthesia Plan: Spinal   Post-op Pain Management:    Induction:   PONV Risk Score and Plan: 1 and Ondansetron, Dexamethasone, Propofol infusion, Midazolam and Treatment may vary due to age or medical condition  Airway Management Planned: Simple Face Mask  Additional Equipment:   Intra-op Plan:   Post-operative Plan:   Informed Consent: I have reviewed the patients History and Physical, chart, labs and discussed the procedure including the risks, benefits and alternatives for the proposed anesthesia with the patient or authorized representative who has indicated his/her understanding and acceptance.     Dental advisory given  Plan Discussed with: CRNA  Anesthesia Plan Comments:          Anesthesia Quick Evaluation

## 2023-06-04 NOTE — Op Note (Signed)
NAME:  Amber Tate NO.: [id number]      MEDICAL RECORD NO.: [id number]      FACILITY:  [facility]      PHYSICIAN:  Shelda Pal  DATE OF BIRTH:  [birth year]     DATE OF PROCEDURE:  06/04/2023                                 OPERATIVE REPORT         PREOPERATIVE DIAGNOSIS: Right  hip osteoarthritis.      POSTOPERATIVE DIAGNOSIS:  Right hip osteoarthritis.      PROCEDURE:  Right total hip replacement through an anterior approach   utilizing DePuy THR system, component size 56 mm pinnacle cup, a size 36+4 neutral   Altrex liner, a size 5 Hi Actis stem with a 36+1.5 delta ceramic   ball.      SURGEON:  Madlyn Frankel. Charlann Boxer, M.D.      ASSISTANT:  Rosalene Billings, PA-C     ANESTHESIA:  Spinal.      SPECIMENS:  None.      COMPLICATIONS:  None.      BLOOD LOSS:  300 cc     DRAINS:  None.      INDICATION OF THE PROCEDURE:  Amber Tate is a 68 y.o. female who had   presented to office for evaluation of right hip pain.  Radiographs revealed   progressive degenerative changes with bone-on-bone   articulation of the  hip joint, including subchondral cystic changes and osteophytes.  The patient had painful limited range of   motion significantly affecting their overall quality of life and function.  The patient was failing to    respond to conservative measures including medications and/or injections and activity modification and at this point was ready   to proceed with more definitive measures.  Consent was obtained for   benefit of pain relief.  Specific risks of infection, DVT, component   failure, dislocation, neurovascular injury, and need for revision surgery were reviewed in the office.     PROCEDURE IN DETAIL:  The patient was brought to operative theater.   Once adequate anesthesia, preoperative antibiotics, 2 gm of Ancef, 1 gm of Tranexamic Acid, and 10 mg of Decadron were administered, the patient was positioned supine on the [employer] table.  Once the patient was safely positioned with adequate padding of boney prominences we predraped out the hip, and used fluoroscopy to confirm orientation of the pelvis.      The right hip was then prepped and draped from proximal iliac crest to   mid thigh with a shower curtain technique.      Time-out was performed identifying the patient, planned procedure, and the appropriate extremity.     An incision was then made 2 cm lateral to the   anterior superior iliac spine extending over the orientation of the   tensor fascia lata muscle and sharp dissection was carried down to the   fascia of the muscle.      The fascia was then incised.  The muscle belly was identified and swept   laterally and retractor placed along the superior neck.  Following   cauterization of the circumflex vessels and removing some pericapsular   fat, a second cobra retractor was placed on the inferior  neck.  A T-capsulotomy was made along the line of the   superior neck to the trochanteric fossa, then extended proximally and   distally.  Tag sutures were placed and the retractors were then placed   intracapsular.  We then identified the trochanteric fossa and   orientation of my neck cut and then made a neck osteotomy with the femur on traction.  The femoral   head was removed without difficulty or complication.  Traction was let   off and retractors were placed posterior and anterior around the   acetabulum.      The labrum and foveal tissue were debrided.  I began reaming with a 50 mm   reamer and reamed up to 55 mm reamer with good bony bed preparation and a 56 mm  cup was chosen.  The final 56 mm Pinnacle cup was then impacted under fluoroscopy to confirm the depth of penetration and orientation with respect to   Abduction and forward flexion.  A screw was placed into the ilium followed by the hole eliminator.  The final   36+4 neutral Altrex liner was impacted with good visualized rim fit.  The cup  was positioned anatomically within the acetabular portion of the pelvis.      At this point, the femur was rolled to 100 degrees.  Further capsule was   released off the inferior aspect of the femoral neck.  I then   released the superior capsule proximally.  With the leg in a neutral position the hook was placed laterally   along the femur under the vastus lateralis origin and elevated manually and then held in position using the hook attachment on the bed.  The leg was then extended and adducted with the leg rolled to 100   degrees of external rotation.  Retractors were placed along the medial calcar and posteriorly over the greater trochanter.  Once the proximal femur was fully   exposed, I used a box osteotome to set orientation.  I then began   broaching with the starting chili pepper broach and passed this by hand and then broached up to 5.  With the 5 broach in place I chose a high offset neck and did several trial reductions.  The offset was appropriate, leg lengths   appeared to be equal best matched with the +1.5 head ball trial confirmed radiographically.   Given these findings, I went ahead and dislocated the hip, repositioned all   retractors and positioned the right hip in the extended and abducted position.  The final 5 Hi Actis stem was   chosen and it was impacted down to the level of neck cut.  Based on this   and the trial reductions, a final 36+1.5 delta ceramic ball was chosen and   impacted onto a clean and dry trunnion, and the hip was reduced.  The   hip had been irrigated throughout the case again at this point.  I did   reapproximate the superior capsular leaflet to the anterior leaflet   using #1 Vicryl.  The fascia of the   tensor fascia lata muscle was then reapproximated using #1 Vicryl and #0 Stratafix sutures.  The   remaining wound was closed with 2-0 Vicryl and running 4-0 Monocryl.   The hip was cleaned, dried, and dressed sterilely using Dermabond and    Aquacel dressing.  The patient was then brought   to recovery room in stable condition tolerating the procedure well.    Morrie Sheldon  Domenic Schwab, PA-C was present for the entirety of the case involved from   preoperative positioning, perioperative retractor management, general   facilitation of the case, as well as primary wound closure as assistant.            Madlyn Frankel Charlann Boxer, M.D.        06/04/2023 9:55 AM

## 2023-06-04 NOTE — Evaluation (Addendum)
Physical Therapy Evaluation Patient Details Name: Amber Tate MRN: [id number] DOB: 05/03/1955 Today's Date: 06/04/2023  History of Present Illness  68 yo female presents to therapy s/p R THA, anterior approach on 06/04/2023 due to failure of conservative measures. Pt PMH includes but is not limited to: arthritis, HLD, HTN and tobacco abuse.  Clinical Impression      Amber Tate is a 68 y.o. female POD 0 s/p r THA. Patient reports IND with mobility at baseline. Patient is now limited by functional impairments (see PT problem list below) and requires CGA and cues for transfers and gait with RW. Patient was able to ambulate 40 and 30 feet x 2  with RW and CGA and cues for safe walker management. Patient educated on safe sequencing for stair mobility, fall risk prevention, use of RW, slowly increasing activity as tolerated, pain management and goal, use of CP/ice and car transfers pt and spouse verbalized understanding of safe guarding position for people assisting with mobility. Patient instructed in exercises to facilitate ROM and circulation reviewed and HO provided. Patient will benefit from continued skilled PT interventions to address impairments and progress towards PLOF. Patient has met mobility goals at adequate level for discharge home with family support and HEP; will continue to follow if pt continues acute stay to progress towards Mod I goals.       If plan is discharge home, recommend the following: A little help with walking and/or transfers;A little help with bathing/dressing/bathroom;Assistance with cooking/housework;Assist for transportation;Help with stairs or ramp for entrance   Can travel by private vehicle        Equipment Recommendations None recommended by PT  Recommendations for Other Services       Functional Status Assessment Patient has had a recent decline in their functional status and demonstrates the ability to make significant improvements in function in a  reasonable and predictable amount of time.     Precautions / Restrictions Precautions Precautions: Fall Restrictions Weight Bearing Restrictions Per Provider Order: No      Mobility  Bed Mobility Overal bed mobility: Needs Assistance Bed Mobility: Supine to Sit     Supine to sit: Supervision     General bed mobility comments: min cues    Transfers Overall transfer level: Needs assistance Equipment used: Rolling walker (2 wheels) Transfers: Sit to/from Stand Sit to Stand: Contact guard assist           General transfer comment: min cues for safety and proper UE and AD placement with bed, commode and recliner transfers    Ambulation/Gait Ambulation/Gait assistance: Contact guard assist Gait Distance (Feet): 40 Feet Assistive device: Rolling walker (2 wheels) Gait Pattern/deviations: Step-to pattern, Antalgic, Trunk flexed Gait velocity: decreased     General Gait Details: slight trunk flexion with B UE support at RW to offload R LE in stance phase, no change in pain report with mobility, cues for posture and proper distance from RW.  Stairs Stairs: Yes Stairs assistance: Contact guard assist Stair Management: Two rails Number of Stairs: 2 General stair comments: cues for safety and sequencing with step navigation with B handrail and pt instruction provided on use of RW to navigate last step to enter home with pt demonstrating verbal understanding  Wheelchair Mobility     Tilt Bed    Modified Rankin (Stroke Patients Only)       Balance Overall balance assessment: Needs assistance Sitting-balance support: Feet supported Sitting balance-Leahy Scale: Good     Standing balance support:  Bilateral upper extremity supported, During functional activity, Reliant on assistive device for balance Standing balance-Leahy Scale: Fair Standing balance comment: static standing no UE support                             Pertinent Vitals/Pain Pain  Assessment Pain Assessment: 0-10 Pain Score: 2  Pain Location: R hip and LE Pain Descriptors / Indicators: Aching, Discomfort, Dull, Guarding, Operative site guarding Pain Intervention(s): Limited activity within patient's tolerance, Monitored during session, Premedicated before session, Repositioned, Ice applied    Home Living Family/patient expects to be discharged to:: Private residence Living Arrangements: Spouse/significant other Available Help at Discharge: Family Type of Home: House Home Access: Stairs to enter Entrance Stairs-Rails: Right (first 2 steps) Entrance Stairs-Number of Steps: 2 + 1   Home Layout: One level Home Equipment: Agricultural consultant (2 wheels);Cane - quad      Prior Function Prior Level of Function : Independent/Modified Independent;Driving             Mobility Comments: IND no AD for all ADLs, self care tasks and IADLs       Extremity/Trunk Assessment        Lower Extremity Assessment Lower Extremity Assessment: RLE deficits/detail RLE Deficits / Details: ankle DF/PF 5/5 RLE Sensation: WNL    Cervical / Trunk Assessment Cervical / Trunk Assessment: Normal  Communication   Communication Communication: No apparent difficulties    Cognition Arousal: Alert Behavior During Therapy: WFL for tasks assessed/performed   PT - Cognitive impairments: No apparent impairments                         Following commands: Intact       Cueing       General Comments      Exercises Total Joint Exercises Ankle Circles/Pumps: AROM, Both, 10 reps Quad Sets: AROM, Right, 5 reps Heel Slides: AROM, Right, 5 reps Hip ABduction/ADduction: AROM, Right, 5 reps, Standing Long Arc Quad: AROM, Right, 5 reps Knee Flexion: AROM, Right, 5 reps, Standing Standing Hip Extension: AROM, Right, 5 reps   Assessment/Plan    PT Assessment Patient needs continued PT services  PT Problem List Decreased strength;Decreased activity tolerance;Decreased  range of motion;Decreased balance;Decreased mobility;Decreased coordination;Pain       PT Treatment Interventions DME instruction;Gait training;Stair training;Functional mobility training;Therapeutic activities;Therapeutic exercise;Balance training;Neuromuscular re-education;Patient/family education;Modalities    PT Goals (Current goals can be found in the Care Plan section)  Acute Rehab PT Goals Patient Stated Goal: camp, do yard work, walk, run, exercise and dance PT Goal Formulation: With patient Time For Goal Achievement: 06/18/23 Potential to Achieve Goals: Good    Frequency 7X/week     Co-evaluation               AM-PAC PT "6 Clicks" Mobility  Outcome Measure Help needed turning from your back to your side while in a flat bed without using bedrails?: None Help needed moving from lying on your back to sitting on the side of a flat bed without using bedrails?: A Little Help needed moving to and from a bed to a chair (including a wheelchair)?: A Little Help needed standing up from a chair using your arms (e.g., wheelchair or bedside chair)?: A Little Help needed to walk in hospital room?: A Little Help needed climbing 3-5 steps with a railing? : A Little 6 Click Score: 19    End of Session Equipment Utilized  During Treatment: Gait belt Activity Tolerance: Patient tolerated treatment well;No increased pain Patient left: in chair;with call bell/phone within reach;with family/visitor present Nurse Communication: Mobility status;Other (comment) (pt readiness for d/c from PT standpoint) PT Visit Diagnosis: Unsteadiness on feet (R26.81);Muscle weakness (generalized) (M62.81);Difficulty in walking, not elsewhere classified (R26.2);Pain Pain - Right/Left: Right Pain - part of body: Hip;Leg    Time: 1458-1540 PT Time Calculation (min) (ACUTE ONLY): 42 min   Charges:   PT Evaluation $PT Eval Low Complexity: 1 Low PT Treatments $Gait Training: 8-22 mins $Therapeutic  Exercise: 8-22 mins PT General Charges $$ ACUTE PT VISIT: 1 Visit         Johnny Bridge, PT Acute Rehab   BROOKELIN FELBER 06/04/2023, 5:43 PM

## 2023-06-04 NOTE — Interval H&P Note (Signed)
History and Physical Interval Note:  06/04/2023 7:02 AM  Amber Tate  has presented today for surgery, with the diagnosis of Right hip osteoarthritis.  The various methods of treatment have been discussed with the patient and family. After consideration of risks, benefits and other options for treatment, the patient has consented to  Procedure(s): TOTAL HIP ARTHROPLASTY ANTERIOR APPROACH (Right) as a surgical intervention.  The patient's history has been reviewed, patient examined, no change in status, stable for surgery.  I have reviewed the patient's chart and labs.  Questions were answered to the patient's satisfaction.     Shelda Pal

## 2023-06-04 NOTE — Discharge Instructions (Signed)

## 2023-06-04 NOTE — Anesthesia Postprocedure Evaluation (Signed)
Anesthesia Post Note  Patient: Amber Tate  Procedure(s) Performed: RIGHT TOTAL HIP ARTHROPLASTY ANTERIOR APPROACH (Right: Hip)     Patient location during evaluation: PACU Anesthesia Type: Spinal Level of consciousness: awake Pain management: pain level controlled Vital Signs Assessment: post-procedure vital signs reviewed and stable Respiratory status: spontaneous breathing, nonlabored ventilation and respiratory function stable Cardiovascular status: blood pressure returned to baseline and stable Postop Assessment: no apparent nausea or vomiting Anesthetic complications: no   No notable events documented.  Last Vitals:  Vitals:   06/04/23 1348 06/04/23 1515  BP: 126/74 128/77  Pulse: 73 77  Resp: 14 16  Temp: 36.9 C   SpO2: 96% 99%    Last Pain:  Vitals:   06/04/23 1515  TempSrc:   PainSc: 0-No pain                 Wendy Mikles P Anvay Tennis

## 2023-06-05 ENCOUNTER — Encounter (HOSPITAL_COMMUNITY): Payer: Self-pay | Admitting: Orthopedic Surgery

## 2023-09-19 ENCOUNTER — Encounter: Payer: Self-pay | Admitting: Emergency Medicine

## 2023-09-19 ENCOUNTER — Ambulatory Visit
Admission: EM | Admit: 2023-09-19 | Discharge: 2023-09-19 | Disposition: A | Discharge status: Home / Self Care | Attending: Family Medicine | Admitting: Family Medicine

## 2023-09-19 DIAGNOSIS — L237 Allergic contact dermatitis due to plants, except food: Secondary | ICD-10-CM | POA: Diagnosis not present

## 2023-09-19 DIAGNOSIS — F17200 Nicotine dependence, unspecified, uncomplicated: Secondary | ICD-10-CM | POA: Diagnosis not present

## 2023-09-19 DIAGNOSIS — J209 Acute bronchitis, unspecified: Secondary | ICD-10-CM

## 2023-09-19 MED ORDER — DOXYCYCLINE HYCLATE 100 MG PO CAPS: 100.0000 mg | ORAL_CAPSULE | Freq: Two times a day (BID) | ORAL | 0 refills | Status: DC

## 2023-09-19 MED ORDER — PREDNISONE 20 MG PO TABS: 40.0000 mg | ORAL_TABLET | Freq: Every day | ORAL | 0 refills | Status: DC

## 2023-09-19 NOTE — ED Provider Notes (Signed)
 [facility]    CSN: [id number] Arrival date & time: 09/19/23  1191      History   Chief Complaint Chief Complaint  Patient presents with   Cough    HPI Amber Tate is a 68 y.o. female.   HPI  Patient is here with 2 complaints.  First, she is a smoker is prone to bronchitis.  She states she has had a cough and congestion for the last few days.  Feeling more short of breath.  Needing to use her Ventolin.  States that she usually needs antibiotics and prednisone to recover from respiratory illness Also has a rash on her did some yard work.  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      Home Medications    Prior to Admission medications   Medication Sig Start Date End Date Taking? Authorizing Provider  albuterol (VENTOLIN HFA) 108 (90 Base) MCG/ACT inhaler Inhale 1-2 puffs into the lungs every 6 (six) hours as needed for wheezing or shortness of breath.   Yes [provider]  BIOTIN PO Take 1 capsule by mouth in the morning.   Yes [provider]  Calcium Carbonate-Vit D-Min (CALCIUM 1200 PO) Take 2 tablets by mouth in the morning.   Yes [provider]  cetirizine (ZYRTEC) 10 MG tablet Take 10 mg by mouth in the morning.   Yes [provider]  cholecalciferol (VITAMIN D3) 25 MCG (1000 UNIT) tablet Take  1,000 Units by mouth in the morning.   Yes [provider]  diclofenac (VOLTAREN) 75 MG EC tablet Take 75 mg by mouth 2 (two) times daily as needed (inflammation/pain.).   Yes [provider]  doxycycline  (VIBRAMYCIN ) 100 MG capsule Take 1 capsule (100 mg total) by mouth 2 (two) times daily. 09/19/23  Yes Stephany Ehrich, MD  estradiol (ESTRACE) 0.1 MG/GM vaginal cream Place 1 Applicatorful vaginally 3 (three) times a week. Apply a pea-sized amount 3 times weekly 10/23/21  Yes [provider]  losartan-hydrochlorothiazide (HYZAAR) 100-12.5 MG tablet Take 1 tablet by mouth in the morning. 08/29/22 09/19/23 Yes [provider]  meloxicam  (MOBIC ) 15 MG tablet Take 1 tablet (15 mg total) by mouth daily. 06/04/23 06/03/24 Yes Earnie Gola, PA-C  methocarbamol  (ROBAXIN ) 500 MG tablet Take 1 tablet (500 mg total) by mouth every 6 (six) hours as needed for muscle spasms. 06/04/23  Yes Earnie Gola, PA-C  Misc Natural Products [facility]) LIQD Take 15 mLs by mouth in the morning.   Yes [provider]  Multiple Vitamin (MULTIVITAMIN WITH MINERALS) TABS tablet Take 1 tablet by mouth in the morning.   Yes [provider]  Omega-3 Fatty Acids (OMEGA-3 PO) Take 1 capsule by  mouth at bedtime. 03/16/19  Yes [provider]  omeprazole (PRILOSEC) 20 MG capsule Take 20 mg by mouth daily before breakfast.   Yes [provider]  oxyCODONE  (ROXICODONE ) 5 MG immediate release tablet Take 1 tablet (5 mg total) by mouth every 4 (four) hours as needed for severe pain (pain score 7-10). 06/04/23  Yes Kim Pen R, PA-C  polyethylene glycol (MIRALAX  / GLYCOLAX ) 17 g packet Take 17 g by mouth 2 (two) times daily. 06/04/23  Yes Earnie Gola, PA-C  predniSONE  (DELTASONE ) 20 MG tablet Take 2 tablets (40 mg total) by mouth daily with breakfast. 09/19/23  Yes Stephany Ehrich, MD  rosuvastatin (CRESTOR) 10 MG tablet Take 10 mg by mouth  every evening. 06/09/20  Yes [provider]  vitamin E 400 UNIT capsule Take 400 Units by mouth in the morning.   Yes [provider]  chlorthalidone (HYGROTON) 25 MG tablet Take 25 mg by mouth daily.  07/21/20  [provider]    Family History Family History  Problem Relation Age of Onset   Diabetes Mother    Stroke Mother     Social History Social History   Tobacco Use   Smoking status: Every Day    Current packs/day: 0.50    Types: Cigarettes   Smokeless tobacco: Never  Vaping Use   Vaping status: Never Used  Substance Use Topics   Alcohol use: Not Currently   Drug use: No     Allergies   Ivp dye [iodinated contrast media]   Review of Systems Review of Systems  See HPI Physical Exam Triage Vital Signs ED Triage Vitals  Encounter Vitals Group     BP 09/19/23 0904 (!) 154/87     Systolic BP Percentile --      Diastolic BP Percentile --      Pulse Rate 09/19/23 0904 66     Resp 09/19/23 0904 18     Temp 09/19/23 0904 97.9 F (36.6 C)     Temp Source 09/19/23 0904 Oral     SpO2 09/19/23 0904 95 %     Weight --      Height --      Head Circumference --      Peak Flow --      Pain Score 09/19/23 0906 0     Pain Loc --      Pain Education --      Exclude from Growth Chart --    No data found.  Updated Vital Signs BP (!) 154/87 (BP Location: Right Arm)   Pulse 66   Temp 97.9 F (36.6 C) (Oral)   Resp 18   SpO2 95%      Physical Exam Constitutional:      General: She is not in acute distress.    Appearance: She is well-developed. She is ill-appearing.  HENT:     Head: Normocephalic and atraumatic.  Eyes:     Conjunctiva/sclera: Conjunctivae normal.     Pupils: Pupils are equal, round, and reactive to light.  Cardiovascular:     Rate and Rhythm: Normal rate.  Pulmonary:     Effort: Pulmonary effort is normal. No respiratory distress.     Breath sounds: Wheezing and rhonchi present.  Abdominal:     General: There  is no distension.     Palpations: Abdomen is soft.  Musculoskeletal:        General: Normal range of motion.     Cervical back: Normal range of  motion.  Skin:    General: Skin is warm and dry.     Findings: Rash present.     Comments: Small vesicular rash on erythematous base over the radial styloid of the right wrist.  Measures 1 x 2 cm  Neurological:     Mental Status: She is alert.      UC Treatments / Results  Labs (all labs ordered are listed, but only abnormal results are displayed) Labs Reviewed - No data to display  EKG   Radiology No results found.  Procedures Procedures (including critical care time)  Medications Ordered in UC Medications - No data to display  Initial Impression / Assessment and Plan / UC Course  I have reviewed the triage vital signs and the nursing notes.  Pertinent labs & imaging results that were available during my care of the patient were reviewed by me and considered in my medical decision making (see chart for details).     Discussed treatment for the small area of poison ivy would be cortisone cream and antihistamines for itching.  Final Clinical Impressions(s) / UC Diagnoses   Final diagnoses:  Acute bronchitis, unspecified organism  Tobacco dependence  Poison ivy dermatitis   Discharge Instructions      Take the doxycycline  2 times a day.  Take this antibiotic with food Take for prednisone , 2 tablets once a day for 5 days  Drink lots of fluids Use Delsym or Mucinex DM for the cough See your doctor if not improved by next week  ED Prescriptions     Medication Sig Dispense Auth. Provider   predniSONE  (DELTASONE ) 20 MG tablet Take 2 tablets (40 mg total) by mouth daily with breakfast. 10 tablet Stephany Ehrich, MD   doxycycline  (VIBRAMYCIN ) 100 MG capsule Take 1 capsule (100 mg total) by mouth 2 (two) times daily. 20 capsule Stephany Ehrich, MD      PDMP not reviewed this encounter.   Stephany Ehrich,  MD 09/19/23 [phone]

## 2023-09-19 NOTE — Discharge Instructions (Addendum)
 Take the doxycycline  2 times a day.  Take this antibiotic with food Take for prednisone, 2 tablets once a day for 5 days  Drink lots of fluids Use Delsym or Mucinex DM for the cough See your doctor if not improved by next week

## 2023-09-19 NOTE — ED Triage Notes (Signed)
 Patient c/o chest congestion and cough x 4 days.  Afebrile.  Patient has been taken Chloricidin for sx's.  Patient also has a bump on her right wrist w/fluid inside of it.  Concern it may be poison ivy.

## 2023-11-22 ENCOUNTER — Encounter: Payer: Self-pay | Admitting: Emergency Medicine

## 2023-11-22 ENCOUNTER — Ambulatory Visit
Admission: EM | Admit: 2023-11-22 | Discharge: 2023-11-22 | Disposition: A | Discharge status: Home / Self Care | Attending: Family Medicine | Admitting: Family Medicine

## 2023-11-22 DIAGNOSIS — J069 Acute upper respiratory infection, unspecified: Secondary | ICD-10-CM | POA: Diagnosis not present

## 2023-11-22 DIAGNOSIS — R051 Acute cough: Secondary | ICD-10-CM

## 2023-11-22 LAB — POC SARS CORONAVIRUS 2 AG -  ED: SARS Coronavirus 2 Ag: NEGATIVE

## 2023-11-22 MED ORDER — DOXYCYCLINE HYCLATE 100 MG PO CAPS: ORAL_CAPSULE | ORAL | 0 refills | Status: DC

## 2023-11-22 MED ORDER — PREDNISONE 20 MG PO TABS: ORAL_TABLET | ORAL | 0 refills | Status: DC

## 2023-11-22 NOTE — Discharge Instructions (Addendum)
 Take plain guaifenesin (1200mg  extended release tabs such as Mucinex) twice daily, with plenty of water , for cough and congestion.   Get adequate rest.   Continue albuterol inhaler as needed. May use Afrin nasal spray (or generic oxymetazoline) each morning for about 5 days and then discontinue.  Also recommend using saline nasal spray several times daily and saline nasal irrigation (AYR is a common brand).  Use Flonase nasal spray each morning after using Afrin nasal spray and saline nasal irrigation. Try warm salt water  gargles for sore throat.  Stop all antihistamines (Coricidin, etc) for now, and other non-prescription cough/cold preparations. May take Delsym Cough Suppressant (12 Hour Cough Relief) at bedtime for nighttime cough.   If symptoms become significantly worse during the night or over the weekend, proceed to the local emergency room.

## 2023-11-22 NOTE — ED Triage Notes (Signed)
 Patient c/o possible bronchitis x 2 days.  Cough, wheezing, chest heaviness worse at night.  Patient has been taken Coricidin HBP and Albuterol Inhaler.  Afebrile.

## 2023-11-22 NOTE — ED Provider Notes (Addendum)
 [facility]    CSN: [id number] Arrival date & time: 11/22/23  1057      History   Chief Complaint Chief Complaint  Patient presents with   Cough    HPI Amber Tate is a 68 y.o. female.   During the past two days patient has developed cough, wheezing, sinus drainage, and chest heaviness, all worse at night.  She has been using her albuterol inhaler more frequently.  She continues to smoke 1/2 pack per day and is concerned that she may have bronchitis.  The history is provided by the patient.    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      Home Medications    Prior to Admission medications   Medication Sig Start Date End Date Taking? Authorizing Provider  albuterol (VENTOLIN HFA) 108 (90 Base) MCG/ACT inhaler Inhale 1-2 puffs into the lungs every 6 (six) hours as needed for wheezing or shortness of breath.   Yes [provider]  BIOTIN PO Take 1 capsule by mouth in the morning.   Yes [provider]  Calcium Carbonate-Vit D-Min (CALCIUM 1200 PO) Take 2 tablets by mouth in the morning.   Yes [provider]  cetirizine (ZYRTEC) 10 MG tablet Take 10 mg by mouth in the morning.   Yes [provider]  cholecalciferol (VITAMIN D3) 25 MCG (1000 UNIT) tablet Take 1,000 Units by mouth in the morning.   Yes [provider]   doxycycline  (VIBRAMYCIN ) 100 MG capsule Take one cap PO Q12hr with food. 11/22/23  Yes Pauline Garnette LABOR, MD  estradiol (ESTRACE) 0.1 MG/GM vaginal cream Place 1 Applicatorful vaginally 3 (three) times a week. Apply a pea-sized amount 3 times weekly 10/23/21  Yes [provider]  omeprazole (PRILOSEC) 20 MG capsule Take 20 mg by mouth daily before breakfast.   Yes [provider]  predniSONE  (DELTASONE ) 20 MG tablet Take one tab by mouth twice daily for 4 days, then one daily for 3 days. Take with food. 11/22/23  Yes Pauline Garnette LABOR, MD  rosuvastatin (CRESTOR) 10 MG tablet Take 10 mg by mouth every evening. 06/09/20  Yes [provider]  diclofenac (VOLTAREN) 75 MG EC tablet Take 75 mg by mouth 2 (two) times daily as needed (inflammation/pain.).    [provider]  losartan-hydrochlorothiazide (HYZAAR) 100-12.5 MG tablet Take 1 tablet by mouth in the morning. 08/29/22 09/19/23  [provider]  meloxicam  (MOBIC ) 15 MG tablet Take 1 tablet (15 mg total) by mouth daily. 06/04/23 06/03/24  Patti Rosina JONELLE, PA-C  methocarbamol  (ROBAXIN ) 500 MG tablet Take 1 tablet (500 mg total) by mouth every 6 (six) hours as needed for muscle spasms. 06/04/23   Patti Rosina JONELLE, PA-C  Misc Natural Products [facility] URINARY HEALTH) LIQD  Take 15 mLs by mouth in the morning.    [provider]  Multiple Vitamin (MULTIVITAMIN WITH MINERALS) TABS tablet Take 1 tablet by mouth in the morning.    [provider]  Omega-3 Fatty Acids (OMEGA-3 PO) Take 1 capsule by mouth at bedtime. 03/16/19   [provider]  oxyCODONE  (ROXICODONE ) 5 MG immediate release tablet Take 1 tablet (5 mg total) by mouth every 4 (four) hours as needed for severe pain (pain score 7-10). 06/04/23   Patti Rosina SAUNDERS, PA-C  polyethylene glycol (MIRALAX  / GLYCOLAX ) 17 g packet Take 17 g by mouth 2 (two) times daily. 06/04/23   Patti Rosina SAUNDERS, PA-C  vitamin E 400 UNIT capsule Take 400 Units by  mouth in the morning.    [provider]  chlorthalidone (HYGROTON) 25 MG tablet Take 25 mg by mouth daily.  07/21/20  [provider]    Family History Family History  Problem Relation Age of Onset   Diabetes Mother    Stroke Mother     Social History Social History   Tobacco Use   Smoking status: Every Day    Current packs/day: 0.50    Types: Cigarettes   Smokeless tobacco: Never  Vaping Use   Vaping status: Never Used  Substance Use Topics   Alcohol use: Not Currently   Drug use: No     Allergies   Ivp dye [iodinated contrast media]   Review of Systems Review of Systems No sore throat + cough No pleuritic pain + wheezing + nasal congestion + post-nasal drainage No sinus pain/pressure No itchy/red eyes No earache No hemoptysis + SOB No fever No nausea No vomiting No abdominal pain No diarrhea No urinary symptoms No skin rash + fatigue No myalgias No headache Used OTC meds (Coricidin) without relief   Physical Exam Triage Vital Signs ED Triage Vitals  Encounter Vitals Group     BP 11/22/23 1224 (!) 176/84     Girls Systolic BP Percentile --      Girls Diastolic BP Percentile --      Boys Systolic BP Percentile --      Boys Diastolic BP Percentile --      Pulse Rate 11/22/23 1224 (!) 59     Resp 11/22/23 1224 18     Temp 11/22/23 1224 97.9 F (36.6 C)     Temp Source 11/22/23 1224 Oral     SpO2 11/22/23 1224 98 %     Weight --      Height --      Head Circumference --      Peak Flow --      Pain Score 11/22/23 1226 0     Pain Loc --      Pain Education --      Exclude from Growth Chart --    No data found.  Updated Vital Signs BP (!) 176/84 (BP Location: Right Arm)   Pulse (!) 59   Temp 97.9 F (36.6 C) (Oral)   Resp 18   SpO2 98%   Visual Acuity Right Eye Distance:   Left Eye Distance:   Bilateral Distance:    Right Eye Near:   Left Eye Near:    Bilateral Near:     Physical Exam Nursing notes and  Vital Signs reviewed. Appearance:  Patient appears stated age, and in no acute distress Eyes:  Pupils are equal, round, and reactive to light and accomodation.  Extraocular movement is intact.  Conjunctivae are not  inflamed  Ears:  Canals normal.  Tympanic membranes normal.  Nose:  Mildly congested turbinates.  No sinus tenderness.  Pharynx:  Normal Neck:  Supple.  Mildly enlarged lateral nodes are present, tender to palpation on the left.   Lungs: Rhonchi left anterior/inferior chest.  Breath sounds are equal.  Moving air well. Heart:  Regular rate and rhythm without murmurs, rubs, or gallops.  Abdomen:  Nontender without masses or hepatosplenomegaly.  Bowel sounds are present.  No CVA or flank tenderness.  Extremities:  No edema.  Skin:  No rash present.   UC Treatments / Results  Labs (all labs ordered are listed, but only abnormal results are displayed) Labs Reviewed  POC SARS CORONAVIRUS 2 AG -  Negative    EKG   Radiology No results found.  Procedures Procedures (including critical care time)  Medications Ordered in UC Medications - No data to display  Initial Impression / Assessment and Plan / UC Course  I have reviewed the triage vital signs and the nursing notes.  Pertinent labs & imaging results that were available during my care of the patient were reviewed by me and considered in my medical decision making (see chart for details).    Because of her smoking history, will begin doxycycline  and prednisone  burst/taper. Followup with Family Doctor if not improved in one week.   Final Clinical Impressions(s) / UC Diagnoses   Final diagnoses:  Acute cough  Upper respiratory tract infection, unspecified type     Discharge Instructions      Take plain guaifenesin (1200mg  extended release tabs such as Mucinex) twice daily, with plenty of water , for cough and congestion.   Get adequate rest.   Continue albuterol inhaler as needed. May use Afrin nasal spray (or  generic oxymetazoline) each morning for about 5 days and then discontinue.  Also recommend using saline nasal spray several times daily and saline nasal irrigation (AYR is a common brand).  Use Flonase nasal spray each morning after using Afrin nasal spray and saline nasal irrigation. Try warm salt water  gargles for sore throat.  Stop all antihistamines (Coricidin, etc) for now, and other non-prescription cough/cold preparations. May take Delsym Cough Suppressant (12 Hour Cough Relief) at bedtime for nighttime cough.   If symptoms become significantly worse during the night or over the weekend, proceed to the local emergency room.     ED Prescriptions     Medication Sig Dispense Auth. Provider   doxycycline  (VIBRAMYCIN ) 100 MG capsule Take one cap PO Q12hr with food. 14 capsule Pauline Garnette LABOR, MD   predniSONE  (DELTASONE ) 20 MG tablet Take one tab by mouth twice daily for 4 days, then one daily for 3 days. Take with food. 11 tablet Pauline Garnette LABOR, MD         Pauline Garnette LABOR, MD 11/23/23 ROANN    Pauline Garnette LABOR, MD 11/23/23 [phone]

## 2023-12-20 ENCOUNTER — Ambulatory Visit
Admission: EM | Admit: 2023-12-20 | Discharge: 2023-12-20 | Disposition: A | Discharge status: Home / Self Care | Attending: Family Medicine | Admitting: Family Medicine

## 2023-12-20 ENCOUNTER — Other Ambulatory Visit: Payer: Self-pay

## 2023-12-20 DIAGNOSIS — J069 Acute upper respiratory infection, unspecified: Secondary | ICD-10-CM

## 2023-12-20 LAB — POC SARS CORONAVIRUS 2 AG -  ED: SARS Coronavirus 2 Ag: NEGATIVE

## 2023-12-20 MED ORDER — PREDNISONE 20 MG PO TABS: ORAL_TABLET | ORAL | 0 refills | Status: AC

## 2023-12-20 NOTE — ED Provider Notes (Signed)
 [facility]    CSN: [id number] Arrival date & time: 12/20/23  1323      History   Chief Complaint Chief Complaint  Patient presents with   Cough    HPI Amber Tate is a 68 y.o. female.   Two days ago patient developed sore throat, nasal congestion, clogged ears and cough.  She denies fever, pleuritic pain, and shortness of breath.  Her husband is positive for covid, being treated today.  She is presently taking Omnicef 300mg  BID for UTI (started taking two days ago).  The history is provided by the patient.    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      Home Medications    Prior to Admission medications   Medication Sig Start Date End Date Taking? Authorizing Provider  cefdinir (OMNICEF) 300 MG capsule Take 300 mg by mouth 2 (two) times daily.   Yes [provider]  oxymetazoline (AFRIN) 0.05 % nasal spray Place 1 spray into both nostrils 2 (two) times daily.   Yes [provider]  phenazopyridine (PYRIDIUM) 95 MG tablet Take 95 mg by mouth 3 (three) times daily as needed for pain.   Yes [provider]  albuterol (VENTOLIN HFA) 108 (90 Base) MCG/ACT inhaler Inhale 1-2 puffs into the lungs every 6 (six) hours as needed for wheezing or shortness of breath.    [provider]  BIOTIN PO Take 1 capsule by mouth in the morning.     [provider]  Calcium Carbonate-Vit D-Min (CALCIUM 1200 PO) Take 2 tablets by mouth in the morning.    [provider]  cetirizine (ZYRTEC) 10 MG tablet Take 10 mg by mouth in the morning.    [provider]  cholecalciferol (VITAMIN D3) 25 MCG (1000 UNIT) tablet Take 1,000 Units by mouth in the morning.    [provider]  diclofenac (VOLTAREN) 75 MG EC tablet Take 75 mg by mouth 2 (two) times daily as needed (inflammation/pain.).    [provider]  doxycycline  (VIBRAMYCIN ) 100 MG capsule Take one cap PO Q12hr with food. 11/22/23   Pauline Garnette LABOR, MD  estradiol (ESTRACE) 0.1 MG/GM vaginal cream Place 1 Applicatorful vaginally 3 (three) times a week. Apply a pea-sized amount 3 times weekly 10/23/21   [provider]  losartan-hydrochlorothiazide (HYZAAR) 100-12.5 MG tablet Take 1 tablet by mouth in the morning. 08/29/22 09/19/23  [provider]  meloxicam  (MOBIC ) 15 MG tablet Take 1 tablet (15 mg total) by mouth daily. 06/04/23 06/03/24  Patti Rosina JONELLE, PA-C  methocarbamol  (ROBAXIN ) 500 MG tablet Take 1 tablet (500 mg total) by mouth every 6 (six) hours as needed for muscle spasms. 06/04/23   Patti Rosina JONELLE, PA-C  Misc Natural Products [facility] URINARY HEALTH) LIQD  Take 15 mLs by mouth in the morning.    [provider]  Multiple Vitamin (MULTIVITAMIN WITH MINERALS) TABS tablet Take 1 tablet by mouth in the morning.    [provider]  Omega-3 Fatty Acids (OMEGA-3 PO) Take 1 capsule by mouth at bedtime. 03/16/19   [provider]  omeprazole (PRILOSEC) 20 MG capsule Take 20 mg by mouth daily before breakfast.    [provider]  oxyCODONE  (ROXICODONE ) 5 MG immediate release tablet Take 1 tablet (5 mg total) by mouth every 4 (four) hours as needed for severe pain (pain score 7-10). 06/04/23   Patti Rosina SAUNDERS, PA-C  polyethylene glycol (MIRALAX  / GLYCOLAX ) 17 g packet Take 17 g by mouth 2 (two) times  daily. 06/04/23   Patti Rosina SAUNDERS, PA-C  predniSONE  (DELTASONE ) 20 MG tablet Take one tab by mouth twice daily for 4 days, then one daily. Take with food. 12/20/23   Pauline Garnette LABOR, MD  rosuvastatin (CRESTOR) 10 MG tablet Take 10 mg by mouth every evening. 06/09/20   [provider]  vitamin E 400 UNIT capsule Take 400 Units by mouth in the morning.    [provider]  chlorthalidone (HYGROTON) 25 MG tablet Take 25 mg by mouth daily.  07/21/20  [provider]    Family History Family History  Problem Relation Age of Onset   Diabetes Mother    Stroke Mother     Social History Social History   Tobacco Use   Smoking status: Every Day    Current packs/day: 0.50    Types: Cigarettes   Smokeless tobacco: Never  Vaping Use   Vaping status: Never Used  Substance Use Topics   Alcohol use: Not Currently   Drug use: No     Allergies   Ivp dye [iodinated contrast media]   Review of Systems Review of Systems + sore throat + cough No pleuritic pain No wheezing + nasal congestion + post-nasal drainage No sinus pain/pressure No itchy/red eyes ? earache No hemoptysis No SOB No fever/chills No nausea No vomiting No abdominal pain No diarrhea No urinary symptoms No skin rash + fatigue No myalgias No headache   Physical Exam Triage Vital Signs ED Triage Vitals  Encounter Vitals Group     BP 12/20/23 1329 (!) 150/82     Girls Systolic BP Percentile --      Girls Diastolic BP Percentile --      Boys Systolic BP Percentile --      Boys Diastolic BP Percentile --      Pulse Rate 12/20/23 1329 75     Resp 12/20/23 1329 16     Temp 12/20/23 1329 97.8 F (36.6 C)     Temp src --      SpO2 12/20/23 1329 95 %     Weight --      Height --      Head Circumference --      Peak Flow --      Pain Score 12/20/23 1332 0     Pain Loc --      Pain Education --      Exclude from Growth Chart --    No data found.  Updated Vital Signs BP (!)  150/82   Pulse 75   Temp 97.8 F (36.6 C)   Resp 16   SpO2 95%   Visual Acuity Right Eye Distance:   Left Eye Distance:   Bilateral Distance:    Right Eye Near:  Left Eye Near:    Bilateral Near:     Physical Exam Nursing notes and Vital Signs reviewed. Appearance:  Patient appears stated age, and in no acute distress Eyes:  Pupils are equal, round, and reactive to light and accomodation.  Extraocular movement is intact.  Conjunctivae are not inflamed  Ears:  Canals normal.  Tympanic membranes normal.  Nose:  Mildly congested turbinates.  No sinus tenderness.  Pharynx:  Normal Neck:  Supple. No adenopathy.  Lungs:  Few scattered rhonchi.  Breath sounds are equal.  Moving air well. Heart:  Regular rate and rhythm without murmurs, rubs, or gallops.  Abdomen:  Nontender without masses or hepatosplenomegaly.  Bowel sounds are present.  No CVA or flank tenderness.  Extremities:  No edema.  Skin:  No rash present.   UC Treatments / Results  Labs (all labs ordered are listed, but only abnormal results are displayed) Labs Reviewed  POC SARS CORONAVIRUS 2 AG -  ED negative    EKG   Radiology No results found.  Procedures Procedures (including critical care time)  Medications Ordered in UC Medications - No data to display  Initial Impression / Assessment and Plan / UC Course  I have reviewed the triage vital signs and the nursing notes.  Pertinent labs & imaging results that were available during my care of the patient were reviewed by me and considered in my medical decision making (see chart for details).    Patient is presently taking Omnicef for UTI.  Add prednisone  burst/taper. Followup with Family Doctor if not improved in one week.   Final Clinical Impressions(s) / UC Diagnoses   Final diagnoses:  Viral URI with cough     Discharge Instructions      Finish Omnicef.  Continue Albuterol inhaler as needed. Take plain guaifenesin (1200mg  extended release  tabs such as Mucinex) twice daily, with plenty of water , for cough and congestion.    Try warm salt water  gargles for sore throat.  Stop all antihistamines for now, and other non-prescription cough/cold preparations. May take Delsym Cough Suppressant (12 Hour Cough Relief) at bedtime for nighttime cough.   If symptoms become significantly worse during the night or over the weekend, proceed to the local emergency room.     ED Prescriptions     Medication Sig Dispense Auth. Provider   predniSONE  (DELTASONE ) 20 MG tablet Take one tab by mouth twice daily for 4 days, then one daily. Take with food. 12 tablet Pauline Garnette LABOR, MD         Pauline Garnette LABOR, MD 12/22/23 [phone]

## 2023-12-20 NOTE — Discharge Instructions (Addendum)
 Finish Omnicef.  Continue Albuterol inhaler as needed. Take plain guaifenesin (1200mg  extended release tabs such as Mucinex) twice daily, with plenty of water , for cough and congestion.    Try warm salt water  gargles for sore throat.  Stop all antihistamines for now, and other non-prescription cough/cold preparations. May take Delsym Cough Suppressant (12 Hour Cough Relief) at bedtime for nighttime cough.   If symptoms become significantly worse during the night or over the weekend, proceed to the local emergency room.

## 2023-12-20 NOTE — ED Triage Notes (Addendum)
 Sinus congestion, cough, chest congestion since Wednesday. Took mucinex, tylenol , afrin. No fever. Currently being tx for uti.  Husband positive for covid.

## 2023-12-21 ENCOUNTER — Telehealth: Payer: Self-pay

## 2023-12-21 ENCOUNTER — Other Ambulatory Visit: Payer: Self-pay | Admitting: Family Medicine

## 2023-12-21 MED ORDER — PAXLOVID (300/100) 20 X 150 MG & 10 X 100MG PO TBPK: 3.0000 | ORAL_TABLET | Freq: Two times a day (BID) | ORAL | 0 refills | Status: AC

## 2023-12-21 NOTE — Telephone Encounter (Signed)
 Seen yesterday for viral URI.  Today at home tested positive for covid.  Chart reviewed.  Will send Rx Paxlovid  to pharmacy.  GFR is 73

## 2023-12-21 NOTE — Telephone Encounter (Signed)
 Pt notified paxlovid  sent to pharmacy

## 2023-12-21 NOTE — Telephone Encounter (Signed)
 Tested positive for COVID at home, was told by Dr. Pauline that she could potentially get antiviral medication if tested positive depending on her current medications. Dr. Maranda notified.
